# Patient Record
Sex: Female | Born: 1950 | Race: White
Health system: Northeastern US, Academic
[De-identification: ages and names within clinical notes are randomized; demographics above are authoritative.]

## PROBLEM LIST (undated history)

## (undated) DIAGNOSIS — R042 Hemoptysis: Secondary | ICD-10-CM

## (undated) DIAGNOSIS — M797 Fibromyalgia: Secondary | ICD-10-CM

## (undated) DIAGNOSIS — F32A Depression, unspecified: Secondary | ICD-10-CM

## (undated) HISTORY — DX: Depression, unspecified: F32.A

## (undated) HISTORY — DX: Fibromyalgia: M79.7

## (undated) HISTORY — DX: Hemoptysis: R04.2

---

## 2004-11-08 LAB — HM COLONOSCOPY

## 2006-09-04 DIAGNOSIS — J309 Allergic rhinitis, unspecified: Secondary | ICD-10-CM | POA: Insufficient documentation

## 2006-09-04 DIAGNOSIS — F32A Depression, unspecified: Secondary | ICD-10-CM | POA: Insufficient documentation

## 2006-09-04 DIAGNOSIS — K912 Postsurgical malabsorption, not elsewhere classified: Secondary | ICD-10-CM | POA: Insufficient documentation

## 2008-11-11 ENCOUNTER — Encounter: Payer: Self-pay | Admitting: Cardiology

## 2009-06-29 LAB — HM MAMMOGRAPHY

## 2009-07-15 ENCOUNTER — Ambulatory Visit: Payer: Self-pay | Admitting: Sports Medicine

## 2009-07-29 ENCOUNTER — Ambulatory Visit: Payer: Self-pay | Admitting: Primary Care

## 2009-09-24 ENCOUNTER — Encounter: Payer: Self-pay | Admitting: Gastroenterology

## 2009-09-24 ENCOUNTER — Ambulatory Visit: Payer: Self-pay | Admitting: Primary Care

## 2009-09-25 NOTE — Progress Notes (Signed)
 Reason For Visit   59 year old woman comes in with concerns of a rash on the occiput of her   scalp, splitting of the skin of her fingertips and itchy rash across the   tops of both shoulders.  She has not changed soaps.  She is not using any   new body lotions.  She is unaware whether her home has a functioning   humidifier but she does have forced air heat.     Medication list was reviewed and reconciled.  Allergies   Egg based serum; Rash  Penicillins.  Current Meds   Prempro 0.45-1.5 MG Tablet;TAKE 1 TABLET DAILY.; RPT  FLUoxetine HCl 20 MG Capsule;TAKE 3 CAPSULE DAILY; Rx  Nabumetone 500 MG Tablet;TAKE 1 TABLET TWICE DAILY.; Rx  Amphetamine-Dextroamphetamine 7.5 MG Tablet;TAKE 1 TABLET 3 TIMES DAILY   code B; Rx  Geodon 20 MG Capsule;TAKE 1 CAPSULE DAILY; Rx  Multivitamins Capsule;TAKE 1 CAPSULE DAILY.; RPT  Vitamin D 1000 UNIT Tablet;TAKE 2 TABLET DAILY; RPT  Calcium 500 MG Tablet;TAKE 1 TABLET 3 TIMES DAILY; RPT.  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  Vital Signs   Recorded by [facility] on 24 Sep 2009 12:15 PM  BP:120/78,   HR: 84 b/min,   Height: 64 in, Weight: 116 lb, BMI: 19.9 kg/m2,   O2 Sat: 98 (%SpO2).  Physical Exam   Examination of the skin of her posterior scalp reveals erythematous skin   from scratching without any rashes noted.  Across the backs of both   shoulders the skin is excoriated and scratching and erythematous with no   clear rash.  Fingertips are fissured and lichenified from chronic dryness.    Examination of the rest of her torso reveals no rashes.  Assessment   She appears to have dermatitis from dry winter skin.  Explained the need   for increased humidification at home.  Advised to take shorter showers with   cool water.  Will treat the excoriated areas on her shoulders with   triamcinolone cream as well as her hands twice daily she will then cover   with a thick skin lotion.  Itching on her scalp will try fluocinonide    solution twice daily for 10 days.  Orders   Triamcinolone Acetonide 0.1 % Cream;APPLY SPARINGLY AND MASSAGE IN TWICE   DAILY; Qty1; R0; Rx.  Fluocinonide 0.05 % Solution;APPLY SPARINGLY TO SCALP TWICE DAILY; Qty1;   R0; Rx.  Signature   Electronically signed by: Lonny Prude  M.D.; 09/25/2009 12:46 PM EST;   Chartered loss adjuster.

## 2009-10-09 ENCOUNTER — Ambulatory Visit: Payer: Self-pay | Admitting: Primary Care

## 2009-10-09 NOTE — Progress Notes (Signed)
 Reason For Visit   59 year-old woman comes in for atypical chest pain.     On Thursday evening she was having her hair colored and noted increased   salivation.  At home later that evening while lying on her stomach she   developed terrible pain in her anterior chest going into her right jaw the   pain was sharp and sudden onset.  She began to elevate again.  She had no   shortness of breath.  Denies any diaphoresis.  She took a Vicodin and   within our symptoms were gone.  She has been exercising 5 days a week doing   cardiac exercise with no difficulty.  She is a nonsmoker.     She describes the pain under her sternum as a tight pressure and is   lingering slightly still.  She has no trouble with walking or being active.    She does not have high cholesterol.     Medication list was reviewed and reconciled.  Allergies   Egg based serum; Rash  Penicillins.  Current Meds   Vitamin D 1000 UNIT Tablet;TAKE 2 TABLET DAILY; RPT  Multivitamins Capsule;TAKE 1 CAPSULE DAILY.; RPT  Triamcinolone Acetonide 0.1 % Cream;APPLY SPARINGLY AND MASSAGE IN TWICE   DAILY.; Rx  Amphetamine-Dextroamphetamine 7.5 MG Tablet;TAKE 1 TABLET 3 TIMES DAILY   code B; Rx  Prempro 0.3-1.5 MG Tablet;; RPT  Nabumetone 500 MG Tablet;TAKE 1 TABLET TWICE DAILY.; Rx  Geodon 20 MG Capsule;TAKE 1 CAPSULE DAILY; Rx  FLUoxetine HCl 20 MG Capsule;TAKE 3 CAPSULE DAILY; Rx  Prempro 0.45-1.5 MG Tablet;TAKE 1 TABLET DAILY.; RPT  Calcium 500 MG Tablet;TAKE 1 TABLET 3 TIMES DAILY; RPT  Fluocinolone Acetonide 0.01 % Solution;APPLY SPARINGLY TO scalp TWICE   DAILY; Rx.  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  Vital Signs   Recorded by [facility] on 09 Oct 2009 08:26 AM  BP:120/76,   HR: 75 b/min,   Temp: 97.7 F,   Height: 64 in, Weight: 116 lb, BMI: 19.9 kg/m2,   O2 Sat: 98 (%SpO2).  Physical Exam   Appears well in no distress.  HEENT: No adenopathy in the anterior-posterior cervical chain.  No    clavicular adenopathy.  Oropharynx clear  Neck veins flat.  No thyroid nodules.    Heart: RRR S1-S2 without murmurs  Lungs: Clear to auscultation  Chest wall is nontender to palpation.  Sternum is nontender.  Results   12 LEAD ELECTROCARDIOGRAM:  Normal sinus rhythm, rate: 75, axis 75   : PR, QRS intervals normal;ST   T-wave segments normal no ischemia.  Impression:  Normal ECG  .  Assessment   59 year old woman with known specific cardiac risk factors presents with   atypical substernal chest pain of sudden onset she describes the pain as a   tightness and it did radiate to her right jaw.  She does not have frequent   GERD symptoms.  Today she feels tired but is not having any recurrent   symptoms like last Thursday night.  She felt tired and did not go to work   on Friday.     Unclear whether this pain represents esophageal spasm from reflux or   atypical cardiac symptoms.  Will proceed with stress echocardiogram to   assess her exercise capacity.  Signature   Electronically signed by: Lonny Prude  M.D.; 10/09/2009 3:22 PM EST;   Chartered loss adjuster.

## 2009-11-02 ENCOUNTER — Ambulatory Visit: Payer: Self-pay | Admitting: Primary Care

## 2009-11-02 NOTE — Progress Notes (Signed)
Reason For Visit   59 year old woman follows ADD and depression.  She has had one additional   episode of sharp chest pain that occurred spontaneously while not exerting   herself.  She had a normal stress echocardiogram February 12.  The chest   pain resolved spontaneously but did last one hour and had radiated into her   right jaw.  She continues to exercise without difficulty.     She feels current dose of dexamethasone he is working very well.  She feels   her mood and depression are also doing fine with current doses of   fluoxetine and Geodon.  She is seeing a therapist Dr. Wallace Cullens.     She recently returned from a nice vacation in [place] visiting her father.    Today she has no complaints.     Medication list was reviewed and reconciled.  Allergies   Egg based serum; Rash  Penicillins.  Current Meds   Calcium 500 MG Tablet;TAKE 1 TABLET 3 TIMES DAILY; RPT  Vitamin D 1000 UNIT Tablet;TAKE 2 TABLET DAILY; RPT  Multivitamins Capsule;TAKE 1 CAPSULE DAILY.; RPT  Prempro 0.45-1.5 MG Tablet;TAKE 1 TABLET DAILY.; RPT  FLUoxetine HCl 20 MG Capsule;TAKE 3 CAPSULE DAILY; Rx  Geodon 20 MG Capsule;TAKE 1 CAPSULE DAILY; Rx  Nabumetone 500 MG Tablet;TAKE 1 TABLET TWICE DAILY.; Rx  Triamcinolone Acetonide 0.1 % Cream;APPLY SPARINGLY AND MASSAGE IN TWICE   DAILY.; Rx  Amphetamine-Dextroamphetamine 7.5 MG Tablet;TAKE 1 TABLET 3 TIMES DAILY   code B; Rx  Fluocinolone Acetonide 0.01 % Solution;APPLY SPARINGLY TO scalp TWICE   DAILY; Rx  Prempro 0.3-1.5 MG Tablet;; RPT.  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  Vital Signs   Recorded by [facility] on 02 Nov 2009 08:54 AM  BP:112/72  Recorded by [facility] on 02 Nov 2009 08:53 AM  HR: 78 b/min,   Temp: 98.2 F,   Height: 64 in, Weight: 116 lb, BMI: 19.9 kg/m2,   O2 Sat: 98 (%SpO2).  Physical Exam   Entire visit spent in counseling.  Assessment   ADD and depression are doing quite well on current medication regimen.       Will make no changes.  Followup in 4 months.     Episodic chest pain appears to perhaps be related to esophageal spasm.    Will continue to observe.  Orders   Renew Amphetamine-Dextroamphetamine 7.5 MG Tablet;TAKE 1 TABLET 3 TIMES   DAILY code B. MDD 3 pills; Qty270; R0; Rx.  Signature   Electronically signed by: Lonny Prude  M.D.; 11/02/2009 10:12 PM EST;   Chartered loss adjuster.

## 2010-02-15 ENCOUNTER — Ambulatory Visit: Payer: Self-pay | Admitting: Primary Care

## 2010-02-16 NOTE — Progress Notes (Signed)
Reason For Visit   59 year old woman comes in to discuss adjustments for her medications. She   notes that on days when the weather is cloudy cold and gloomy she feels   achy fatigue as if she were physically sick. On some days she feels much   better. She was experimenting and on these days where she experienced   increased fatigue and achiness she would take Adderall XR 20 mg once and   this would help quite a bit. She had been taking Adderall immediate release   7.5 mg 3 times daily for ADD. However she had some left over 20 mg pills.   She wonders if she should switch to a 20 mg sustained release pill and then   used a 7.5 mg pill later in the afternoon.     She feels her mood and depression are doing well and she remains physically   active.     Medication list was reviewed and reconciled.  Allergies   Egg based serum; Rash  Penicillins.  Current Meds   Calcium 500 MG Tablet;TAKE 1 TABLET 3 TIMES DAILY; RPT  Vitamin D 1000 UNIT Tablet;TAKE 2 TABLET DAILY; RPT  Multivitamins Capsule;TAKE 1 CAPSULE DAILY.; RPT  Prempro 0.45-1.5 MG Tablet;TAKE 1 TABLET DAILY.; RPT  Geodon 20 MG Capsule;TAKE 1 CAPSULE DAILY; Rx  Nabumetone 500 MG Tablet;TAKE 1 TABLET TWICE DAILY.; Rx  Triamcinolone Acetonide 0.1 % Cream;APPLY SPARINGLY AND MASSAGE IN TWICE   DAILY.; Rx  Fluocinolone Acetonide 0.01 % Solution;APPLY SPARINGLY TO scalp TWICE   DAILY; Rx  Prempro 0.3-1.5 MG Tablet;; RPT  FLUoxetine HCl 20 MG Capsule;TAKE 3 CAPSULE DAILY; Rx  Amphetamine-Dextroamphetamine 7.5 MG Tablet;TAKE 1 TABLET 3 TIMES DAILY   code B. MDD 3 pills; Rx.  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  Vital Signs   Recorded by [facility] on 15 Feb 2010 08:51 AM  BP:116/78,   HR: 75 b/min,   Temp: 98.1 F,   Height: 64 in, Weight: 114 lb, BMI: 19.6 kg/m2,   O2 Sat: 99 (%SpO2).  Physical Exam   Entire visit was spent in discussion.  Patient Profile   Prev medicine administration of health risk  questionnaire  ANNUAL RISK  Assessment   Mood disorder including depression, dysphoria with ADD. I agree that since   he seems to do better with the longer acting larger dose of dexamphetamine   will switch to 20 mg sustained release in the morning and have her take 7.5   mg in the afternoon.  Orders   Renew Amphetamine-Dextroamphetamine 7.5 MG Tablet;TAKE 1 TABLET later in   the afternoon; Qty30; R0; Rx.  Amphetamine-Dextroamphetamine 20 MG Capsule Extended Release 24 Hour;TAKE 1   CAPSULE DAILY; Qty30; R0; Rx.  Signature   Electronically signed by: Lonny Prude  M.D.; 02/16/2010 10:41 AM EST;   Chartered loss adjuster.

## 2010-03-08 ENCOUNTER — Ambulatory Visit: Payer: Self-pay | Admitting: Primary Care

## 2010-03-21 ENCOUNTER — Ambulatory Visit: Payer: Self-pay | Admitting: Primary Care

## 2010-03-21 NOTE — Progress Notes (Signed)
Reason For Visit   59 year old woman follows up for depression. Overall she feels quite well.   She feels strongly that gluten does make her feel more poorly especially   with fatigue and stomach upset. When she has weak based posterior she   developed stomach upset and she even develops a faint skin rash her chest   this rash is not consistent with dermatitis herpetiformis.     She adjusts her dose of Adderall on her own. On days where the weather is   dark and gloomy and raining she will take 20 mg sustained release in the   morning and this seems to help with her energy. Better sunn ya day she will   take 7.5 mg 3 times daily. This seems to work the best for her.     She feels she is doing well on current dose of Geodon and Prozac. Work is   going well.     Medication list was reviewed and reconciled. Continues to see her therapist   every couple weeks and this has been very helpful.  Allergies   Egg based serum; Rash  Penicillins.  Current Meds   Calcium 500 MG Tablet;TAKE 1 TABLET 3 TIMES DAILY; RPT  Vitamin D 1000 UNIT Tablet;TAKE 2 TABLET DAILY; RPT  Multivitamins Capsule;TAKE 1 CAPSULE DAILY.; RPT  Prempro 0.45-1.5 MG Tablet;TAKE 1 TABLET DAILY.; RPT  Nabumetone 500 MG Tablet;TAKE 1 TABLET TWICE DAILY.; Rx  Triamcinolone Acetonide 0.1 % Cream;APPLY SPARINGLY AND MASSAGE IN TWICE   DAILY.; Rx  Prempro 0.3-1.5 MG Tablet;; RPT  FLUoxetine HCl 20 MG Capsule;TAKE 3 CAPSULE DAILY; Rx  Amphetamine-Dextroamphetamine 7.5 MG Tablet;TAKE 1 TABLET later in the   afternoon; Rx  Amphetamine-Dextroamphetamine 20 MG Capsule Extended Release 24 Hour;TAKE 1   CAPSULE DAILY.; Rx  Geodon 20 MG Capsule;TAKE 1 CAPSULE DAILY; Rx  Chlorhexidine Gluconate 0.12 % Solution;; RPT  Ibuprofen 800 MG Tablet;; RPT  Fluocinolone Acetonide 0.01 % Solution;APPLY SPARINGLY TO scalp TWICE   DAILY; Rx.  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  Vital Signs   Recorded by [facility] on 21 Mar 2010 08:55 AM  BP:134/84,   HR: 84 b/min,   Temp: 99.0 F,   Height: 64 in, Weight: 115 lb, BMI: 19.7 kg/m2,   O2 Sat: 99 (%SpO2).  Physical Exam   Appears well in no distress.  HEENT: No adenopathy in the anterior-posterior cervical chain.  No   clavicular adenopathy.  Oropharynx clear  Neck veins flat.  No thyroid nodules.    Heart: RRR S1-S2 without murmurs  Lungs: Clear to auscultation.  Assessment   Depression anxiety and fatigue persists intermittently but otherwise she   feels she is going quite well and stable. Not requesting any change in   medication. Continues with therapy. Renewed medications     Followup in 3 months.  Orders   Renew Amphetamine-Dextroamphetamine 7.5 MG Tablet;TAKE 1 TABLET 3 TIMES   DAILY code B; Qty270; R0; Rx.  Signature   Electronically signed by: Lonny Prude  M.D.; 03/21/2010 10:40 PM EST;   Chartered loss adjuster.

## 2010-04-28 ENCOUNTER — Ambulatory Visit: Payer: Self-pay | Admitting: Primary Care

## 2010-04-28 NOTE — Progress Notes (Signed)
Reason For Visit   59 year old woman comes in with respiratory symptoms for the past 2 days.   She was treated on August 13 with azithromycin or a sinus infection. She   improved but over the past 2 days has had a fever of 100.2, dark green   phlegm and occasional cough no sinus congestion. Not short of breath. She   ass fatigue. occasional chills. Nonsmoker     Medication list was reviewed and reconciled.  Allergies   Egg based serum; Rash  Penicillins.  Current Meds   Calcium 500 MG Tablet;TAKE 1 TABLET 3 TIMES DAILY; RPT  Vitamin D 1000 UNIT Tablet;TAKE 2 TABLET DAILY; RPT  Multivitamins Capsule;TAKE 1 CAPSULE DAILY.; RPT  Nabumetone 500 MG Tablet;TAKE 1 TABLET TWICE DAILY.; Rx  Prempro 0.3-1.5 MG Tablet;; RPT  FLUoxetine HCl 20 MG Capsule;TAKE 3 CAPSULE DAILY; Rx  Amphetamine-Dextroamphetamine 20 MG Capsule Extended Release 24 Hour;TAKE 1   CAPSULE DAILY.; Rx  Geodon 20 MG Capsule;TAKE 1 CAPSULE DAILY; Rx  Chlorhexidine Gluconate 0.12 % Solution;; RPT  Ibuprofen 800 MG Tablet;; RPT  Fluocinolone Acetonide 0.01 % Solution;APPLY SPARINGLY TO scalp TWICE   DAILY; Rx  Amphetamine-Dextroamphetamine 7.5 MG Tablet;TAKE 1 TABLET 3 TIMES DAILY   code B; Rx  Triamcinolone Acetonide 0.1 % Cream;APPLY SPARINGLY AND MASSAGE IN TWICE   DAILY.; Rx.  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  Vital Signs   Recorded by [facility] on 28 Apr 2010 10:48 AM  BP:120/80  Recorded by [facility] on 28 Apr 2010 10:47 AM  HR: 85 b/min,   Temp: 98.3 F,   Height: 63.5 in, Weight: 114 lb, BMI: 19.9 kg/m2,   O2 Sat: 99 (%SpO2).  Physical Exam   Appears well in no distress.  HEENT: No adenopathy in the anterior-posterior cervical chain.  No   clavicular adenopathy.  Oropharynx clear  Neck veins flat.  No thyroid nodules.    Heart: RRR S1-S2 without murmurs  Lungs: Clear to auscultation.  Assessment   Upper respiratory infection uncomplicated. However she has fever and   discolored  phlegm. Will treat with doxycycline.  Orders   Doxycycline Hyclate 100 MG Tablet;TAKE 1 TABLET TWICE DAILY; Qty20; R0; Rx.  Work Restrictions;due to illness out of work 8/31-09/08/2009; Qty1; R0; Rx.  Signature   Electronically signed by: Lonny Prude  M.D.; 04/28/2010 9:34 PM EST;   Chartered loss adjuster.

## 2010-05-31 ENCOUNTER — Ambulatory Visit: Payer: Self-pay | Admitting: Primary Care

## 2010-05-31 NOTE — Progress Notes (Signed)
 Reason For Visit   59 year old woman followed for depression. Overall she is doing quite well.   Her cough and congestion her last visit has resolved. She felt ill with   doxycycline and was not able to complete antibiotic.     She continues to have significant fatigue when impairment or pressure drops   and she finds improvement when she takes a single long-acting Adderall 20   mg intermittently.     Otherwise she feels well and has been able to exercise. Work has been fine   not too stressful.     There's been no change in her medications for depression.  Allergies   Doxycycline Hyclate TABS; Nausea  Egg based serum; Rash  Penicillins.  Current Meds   Work Restrictions;due to illness out of work 8/31-09/08/2009; Rx  Amphetamine-Dextroamphetamine 7.5 MG Tablet;TAKE 1 TABLET 3 TIMES DAILY   code B; Rx  Fluocinolone Acetonide 0.01 % Solution;APPLY SPARINGLY TO scalp TWICE   DAILY; Rx  FLUoxetine HCl 20 MG Capsule;TAKE 3 CAPSULE DAILY; Rx  Nabumetone 500 MG Tablet;TAKE 1 TABLET TWICE DAILY.; Rx  Calcium 500 MG Tablet;TAKE 1 TABLET 3 TIMES DAILY; RPT  Amphetamine-Dextroamphetamine 20 MG Capsule Extended Release 24 Hour;TAKE 1   CAPSULE DAILY.; Rx  Geodon 20 MG Capsule;TAKE 1 CAPSULE DAILY; Rx  Prempro 0.3-1.5 MG Tablet;; RPT  Vitamin D 1000 UNIT Tablet;TAKE 2 TABLET DAILY; RPT  Multivitamins Capsule;TAKE 1 CAPSULE DAILY.; RPT  Triamcinolone Acetonide 0.1 % Cream;APPLY SPARINGLY AND MASSAGE IN TWICE   DAILY.; Rx.  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  Vital Signs   Recorded by [facility] on 31 May 2010 08:56 AM  BP:118/80,   HR: 84 b/min,   Temp: 98.6 F,   Height: 63.5 in, Weight: 115 lb, BMI: 20.1 kg/m2,   O2 Sat: 99 (%SpO2).  Physical Exam   Appears well in no distress.  HEENT: No adenopathy in the anterior-posterior cervical chain.  No   clavicular adenopathy.  Oropharynx clear  Neck veins flat.  No thyroid nodules.    Heart: RRR S1-S2 without  murmurs  Lungs: Clear to auscultation.  Assessment   Depression and fatigue doing well with her change with weather change with   dropping her pressure. occasional use of longer acting Adderall has been   useful to help with these days of worsening fatigue.     Followup in 4 months.  Signature   Electronically signed by: Lonny Prude  M.D.; 05/31/2010 10:13 PM EST;   Chartered loss adjuster.

## 2010-06-23 ENCOUNTER — Ambulatory Visit: Payer: Self-pay | Admitting: Sports Medicine

## 2010-06-23 ENCOUNTER — Ambulatory Visit: Payer: Self-pay

## 2010-07-04 NOTE — Progress Notes (Signed)
 Shanedra is seen in follow-up status post right knee partial medial and  lateral meniscectomy and chondroplasty on 11/16/2008. Primary complaint is  anterior-based knee pain. Has pain with a burning quality after prolonged  sitting. Experienced significant pain with attempt at golf this summer.  Would like to discuss.    PHYSICAL EXAMINATION: Examination demonstrates range of 0 to 135 degrees.  Moderate patellofemoral crepitance with range of motion. Ligamentously  stable. No joint line tenderness.    ASSESSMENT:  Chondromalacia patella. Imaging from arthroscopy was reviewed,  demonstrating significant grade 3 fibrillation diffusely, as well as  exposed bone on the patella.    PLAN:  I would treat her symptoms currently as arthritic. She will start  taking glucosamine on a regular basis. She will attempt meloxicam 15 mg  daily. She had an adverse reaction to corticosteroid injection in the past,  and therefore, alternate option would be a viscosupplementation series.  She will let me know if she would like to proceed with the  viscosupplementation series.                    Electronically Signed and Finalized  by  Verita Schneiders, MD 07/04/2010 06:35  ___________________________________________  Verita Schneiders, MD      DD:   06/23/2010  DT:   06/24/2010  5:23 P  [id number]  [id number]    cc:

## 2010-07-07 ENCOUNTER — Ambulatory Visit: Payer: Self-pay

## 2010-07-19 ENCOUNTER — Ambulatory Visit: Payer: Self-pay | Admitting: Primary Care

## 2010-07-19 NOTE — Progress Notes (Signed)
 Reason For Visit   59 year old woman comes in with concerns of waves of fatigue that seemed to   occur a few times a day several days per week. She notes fatigue exhaustion   and occasional nausea. She states 9 hours per night. She does not feel   depressed he is not anxious. Work is okay. She exercises without difficulty   4 times a week he remains motivated. She continues on a gluten-free diet   which she has chosen to do empirically.     More recently she complains of feeling fatigue and completed all day long.   She wonders if she is adrenal insufficient. She has extensive testing in   the past including normal B12 and thyroid function testing.     She is not hypotensive and has not lost weight     Medication list was reviewed and reconciled.  Allergies   Doxycycline Hyclate TABS; Nausea  Egg based serum; Rash  Penicillins.  Current Meds   Work Restrictions;due to illness out of work 8/31-09/08/2009; Rx  Nabumetone 500 MG Tablet;TAKE 1 TABLET TWICE DAILY.; Rx  FLUoxetine HCl 20 MG Capsule;TAKE 3 CAPSULE DAILY; Rx  Amphetamine-Dextroamphetamine 20 MG Capsule Extended Release 24 Hour;TAKE 1   CAPSULE DAILY.; Rx  Meloxicam 15 MG Tablet;; RPT  Vitamin D 1000 UNIT Tablet;TAKE 1 TABLET DAILY.; Rx  Amphetamine-Dextroamphetamine 7.5 MG Tablet;TAKE 1 TABLET 3 TIMES DAILY   code B; Rx  Calcium 500 MG Tablet;TAKE 1 TABLET 3 TIMES DAILY; RPT  Geodon 20 MG Capsule;TAKE 1 CAPSULE DAILY; Rx  Multivitamins Capsule;TAKE 1 CAPSULE DAILY.; RPT.  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  Vital Signs   Recorded by [facility] on 19 Jul 2010 09:21 AM  BP:122/76,   HR: 86 b/min,   Temp: 98.3 F,   Height: 63.5 in, Weight: 117 lb, BMI: 20.4 kg/m2,   O2 Sat: 97 (%SpO2).  Physical Exam   Appears well in no distress.  HEENT: No adenopathy in the anterior-posterior cervical chain.  No   clavicular adenopathy.  Oropharynx clear  Neck veins flat.  No thyroid nodules.    Heart: RRR  S1-S2 without murmurs  Lungs: Clear to auscultation.  Assessment   59 year old woman with history of depression feels that she is not   depressed and is not anxious. She remains physically active and is able to   exercise without weakness or fatigue. However weigh himself if he calmed   over her throughout the day and she feels depleted. She worries she is a   generally insufficient.     Will check a.m. salivary cortisol to screen for adrenal insufficiency. Also   repeat B12 CBC iron function tests and iron.  Signature   Electronically signed by: Lonny Prude  M.D.; 07/19/2010 11:10 PM EST;   Chartered loss adjuster.

## 2010-07-20 ENCOUNTER — Ambulatory Visit
Admit: 2010-07-20 | Discharge: 2010-07-20 | Disposition: A | Discharge status: Home / Self Care | Payer: Self-pay | Source: Ambulatory Visit | Attending: Primary Care | Admitting: Primary Care

## 2010-07-20 LAB — COMPREHENSIVE METABOLIC PANEL
ALT: 27 U/L (ref 0–35)
AST: 40 U/L — ABNORMAL HIGH (ref 0–35)
Albumin: 4.6 g/dL (ref 3.5–5.2)
Alk Phos: 75 U/L (ref 35–105)
Anion Gap: 11 (ref 7–16)
Bilirubin,Total: 0.4 mg/dL (ref 0.0–1.2)
CO2: 26 mmol/L (ref 20–28)
Calcium: 9 mg/dL (ref 8.6–10.2)
Chloride: 102 mmol/L (ref 96–108)
Creatinine: 0.86 mg/dL (ref 0.51–0.95)
GFR,Black: >59 *
GFR,Caucasian: >59 *
Glucose: 82 mg/dL (ref 74–106)
Lab: 14 mg/dL (ref 6–20)
Potassium: 4.6 mmol/L (ref 3.3–5.1)
Sodium: 139 mmol/L (ref 133–145)
Total Protein: 6.5 g/dL (ref 6.3–7.7)

## 2010-07-20 LAB — CBC AND DIFFERENTIAL
Baso # K/uL: 0 THOU/uL (ref 0.0–0.1)
Basophil %: 0.8 % (ref 0.1–1.2)
Eos # K/uL: 0.1 THOU/uL (ref 0.0–0.4)
Eosinophil %: 2.1 % (ref 0.7–5.8)
Hematocrit: 42 % (ref 34–45)
Hemoglobin: 13.7 g/dL (ref 11.2–15.7)
Lymph # K/uL: 0.8 THOU/uL — ABNORMAL LOW (ref 1.2–3.7)
Lymphocyte %: 16.9 % — ABNORMAL LOW (ref 19.3–51.7)
MCV: 90 fL (ref 79–95)
Mono # K/uL: 0.5 THOU/uL (ref 0.2–0.9)
Monocyte %: 10.6 % (ref 4.7–12.5)
Neut # K/uL: 3.3 THOU/uL (ref 1.6–6.1)
Platelets: 220 THOU/uL (ref 160–370)
RBC: 4.6 MIL/uL (ref 3.9–5.2)
RDW: 13.7 % (ref 11.7–14.4)
Seg Neut %: 69.6 % (ref 34.0–71.1)
WBC: 4.7 THOU/uL (ref 4.0–10.0)

## 2010-07-20 LAB — VITAMIN B12: Vitamin B12: 908 pg/mL (ref 211–946)

## 2010-07-20 LAB — FERRITIN: Ferritin: 29 ng/mL (ref 10–120)

## 2010-07-20 LAB — TSH: TSH: 0.97 u[IU]/mL (ref 0.27–4.20)

## 2010-07-23 LAB — CORTISOL, SALIVARY: Cortisol,Salivary: 792 ng/dL

## 2010-07-25 LAB — VITAMIN B6: Vitamin B6: 78.6 nmol/L (ref 20.0–125.0)

## 2010-08-30 ENCOUNTER — Ambulatory Visit: Payer: Self-pay | Admitting: Primary Care

## 2010-08-30 NOTE — Progress Notes (Signed)
 Reason For Visit   60 year old woman comes in to followup for ADD and fatigue. She continues   to note episodes of fatigue that occur randomly throughout the day. It is a   generalized fatigue that will last up to one hour. She occasionally notes a   little nausea. She monitor her blood pressure carefully and it was all over   the board slightly low in slightly high but nothing dramatic.     Her mood is doing well and she denies any depression.     She says when she feels fatigue it seems that her energy is crashing. I   raise the question whether this may be due to wearing off of her short   acting Adderall which has been 7.5 mg 2 times daily. Due to a Warehouse manager of generic Adderall she agreed to try 15 mg of sustained release   each morning.     She will call me in a few weeks to tell me if she feels any different.     Followup in 3 months.  Allergies   Doxycycline Hyclate TABS; Nausea  Egg based serum; Rash  Penicillins.  Current Meds   Calcium 500 MG Tablet;TAKE 1 TABLET 3 TIMES DAILY; RPT  Multivitamins Capsule;TAKE 1 CAPSULE DAILY.; RPT  Nabumetone 500 MG Tablet;TAKE 1 TABLET TWICE DAILY.; Rx  FLUoxetine HCl 20 MG Capsule;TAKE 3 CAPSULE DAILY; Rx  Geodon 20 MG Capsule;TAKE 1 CAPSULE DAILY; Rx  Work Restrictions;due to illness out of work 8/31-09/08/2009; Rx  Amphetamine-Dextroamphetamine 20 MG Capsule Extended Release 24 Hour;TAKE 1   CAPSULE DAILY.; Rx  Meloxicam 15 MG Tablet;; RPT  Vitamin D 1000 UNIT Tablet;TAKE 1 TABLET DAILY.; Rx  Amphetamine-Dextroamphetamine 7.5 MG Tablet;TAKE 1 TABLET 3 TIMES DAILY   code B; Rx.  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  Vital Signs   Recorded by [facility] on 30 Aug 2010 08:56 AM  BP:128/86,   HR: 85 b/min,   Temp: 98.2 F,   Height: 63.75 in, Weight: 119 lb, BMI: 20.6 kg/m2,   O2 Sat: 98 (%SpO2).  Orders   Adderall XR 15 MG Capsule Extended Release 24 Hour;TAKE 1 CAPSULE DAILY IN   THE MORNING. MDD 1  pill; Qty90; R0; Rx.  Signature   Electronically signed by: Lonny Prude  M.D.; 08/30/2010 11:26 PM EST;   Chartered loss adjuster.

## 2010-09-04 NOTE — Miscellaneous (Unsigned)
 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)   Diagnosis: Allergic Rhinitis (477.9)   Diagnosis: Depression (311)     Alerts  Allergy - Doxycycline Hyclate TABS Nausea   Allergy - Egg based serum Rash   Allergy - Penicillins     Medications  Adderall XR 15 MG Capsule Extended Release 24 Hour; TAKE 1 CAPSULE DAILY IN   THE MORNING.MMD 1Code B ; Rx   Adderall XR 20 MG Capsule Extended Release 24 Hour; TAKE 1 CAPSULE DAILY   instead of Adderall XR 15mg  on day with increased fatgue ; Rx   Amphetamine-Dextroamphetamine 7.5 MG Tablet; TAKE 1 TABLET DAILY AS   DIRECTED CODE B ; Rx   Calcium 500 MG Tablet; TAKE 1 TABLET 3 TIMES DAILY ; RPT   FLUoxetine HCl 20 MG Capsule; TAKE 3 CAPSULE DAILY ; Rx   Geodon 20 MG Capsule; TAKE 1 CAPSULE DAILY ; Rx   Multivitamins Capsule; TAKE 1 CAPSULE DAILY. ; RPT   Nabumetone 500 MG Tablet; TAKE 1 TABLET TWICE DAILY. using prn ; Rx   ProAir HFA 108 (90 Base) MCG/ACT Aerosol Solution; INHALE 2 PUFFS EVERY 4-6   HOURS PRN COUGH, WHEEZE, SOB, CHEST TIGHTNESS, SPACED 60 SECONDS APART. ;   Rx   Vitamin D 1000 UNIT Tablet; TAKE 2 TABLET DAILY ; Rx     Immunizations  Td   Tdap (Adacel)

## 2010-10-14 ENCOUNTER — Encounter: Payer: Self-pay | Admitting: Primary Care

## 2010-10-14 ENCOUNTER — Ambulatory Visit: Payer: Self-pay | Admitting: Primary Care

## 2010-10-14 NOTE — Progress Notes (Signed)
 Reason For Visit   Cough.  HPI   3 weeks ago she had a flulike illness with sore throat headache chills and   fatigue which generally improved however she started coughing and the cough   has persisted and got worse over time.  She's had productive sputum, no   chest pain or shortness of breath but some chest tightness with exhalation.    She's had chills and a temperature of 99.6 last night.  She has not used   an inhaler recently although she remembers giving being given one in the   past.  Her energy is poor.     Medications reviewed and reconciled.  Allergies   Doxycycline Hyclate TABS; Nausea  Egg based serum; Rash  Penicillins.  Current Meds   Calcium 500 MG Tablet;TAKE 1 TABLET 3 TIMES DAILY; RPT  Multivitamins Capsule;TAKE 1 CAPSULE DAILY.; RPT  FLUoxetine HCl 20 MG Capsule;TAKE 3 CAPSULE DAILY; Rx  Geodon 20 MG Capsule;TAKE 1 CAPSULE DAILY; Rx  Vitamin D 1000 UNIT Tablet;TAKE 1 TABLET DAILY.; Rx  Adderall XR 15 MG Capsule Extended Release 24 Hour;TAKE 1 CAPSULE DAILY IN   THE MORNING. MDD 1 pill; Rx  Nabumetone 500 MG Tablet;TAKE 1 TABLET TWICE DAILY. using prn; Rx  Azithromycin 250 MG Tablet;2 tablets on day 1, then 1 tab each day x 4   days; Rx  ProAir HFA 108 (90 Base) MCG/ACT Aerosol Solution;INHALE 2 PUFFS EVERY 4-6   HOURS PRN COUGH, WHEEZE, SOB, CHEST TIGHTNESS, SPACED 60 SECONDS APART.; Rx.  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  Vital Signs   Recorded by Donnal Moat on 14 Oct 2010 01:53 PM  BP:124/78,  RUE,  Sitting,   HR: 90 b/min,   Temp: 98.2 F,  Tympanic,   Height: 63.75 in, Weight: 119 lb, BMI: 20.6 kg/m2,   O2 Sat: 98 (%SpO2).  Physical Exam   Vital signs as noted  She last took Aleve at 9 AM     Pleasant woman appears in no acute distress  Anicteric conjunctiva pink pupils equal reactive  Oropharynx mild erythema no exudate  No cervical lymphadenopathy  Lungs are clear no crackles wheezes rhonchi  Heart exam regular rate and  rhythm normal S1-S2 no murmur.  Assessment   Assessment and Plan: Bronchitis.  Given her initial improvement with recent   worsening symptoms and some low-grade fevers as well as fatigue I did start   her on some Zithromax and gave her an inhaler for when necessary use.    Advised to call if she has worsening symptoms which I reviewed with her at   length  .  Signature   Electronically signed by: Donnal Moat  M.D.; 10/14/2010 2:13 PM EST.

## 2010-11-28 ENCOUNTER — Encounter: Payer: Self-pay | Admitting: Primary Care

## 2010-11-28 ENCOUNTER — Ambulatory Visit: Payer: Self-pay | Admitting: Primary Care

## 2010-11-28 NOTE — Progress Notes (Signed)
 Reason For Visit   60 year old woman comes in to follow for ADD and is concerned that her   sense of body aching and fatigue related to weather and barometric changes   may have something to do with a rheumatological problem.  She notes new   triggering of her left thumb.     Overall she feels she has done better on a gluten-free diet. She is also   pleased with the benefit of Adderall. She usually takes Adderall XR 15mg  in   the morning and Adderall 7.5 mg in mid afternoon. When there are weather   changes when she feels more fatigue and achy she will take Adderall XR 20   mg.     Her weight is up about 4 pounds which is good. She had been underweight.   Her mood and depression are fine except when she has increased fatigue   during changes in the weather.     She denies any specific swelling of her joints or tenderness of her muscles.     Medication list was reviewed and reconciled.  Allergies   Doxycycline Hyclate TABS; Nausea  Egg based serum; Rash  Penicillins.  Current Meds   Calcium 500 MG Tablet;TAKE 1 TABLET 3 TIMES DAILY; RPT  Multivitamins Capsule;TAKE 1 CAPSULE DAILY.; RPT  FLUoxetine HCl 20 MG Capsule;TAKE 3 CAPSULE DAILY; Rx  Geodon 20 MG Capsule;TAKE 1 CAPSULE DAILY; Rx  Nabumetone 500 MG Tablet;TAKE 1 TABLET TWICE DAILY. using prn; Rx  ProAir HFA 108 (90 Base) MCG/ACT Aerosol Solution;INHALE 2 PUFFS EVERY 4-6   HOURS PRN COUGH, WHEEZE, SOB, CHEST TIGHTNESS, SPACED 60 SECONDS APART.; Rx  Vitamin D 1000 UNIT Tablet;TAKE 2 TABLET DAILY; Rx  Adderall XR 15 MG Capsule Extended Release 24 Hour;TAKE 1 CAPSULE DAILY IN   THE MORNING.MMD 1Code B; Rx.  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  Vital Signs   Recorded by [facility] on 28 Nov 2010 04:17 PM  BP:132/84,   HR: 90 b/min,   Temp: 98.6 F,   Height: 63.5 in, Weight: 118 lb, BMI: 20.6 kg/m2,   O2 Sat: 98 (%SpO2).  Physical Exam   Appears well in no distress.  HEENT: No adenopathy in the  anterior-posterior cervical chain.  No   clavicular adenopathy.  Oropharynx clear  Neck veins flat.  No thyroid nodules.    Heart: RRR S1-S2 without murmurs  Lungs: Clear to auscultation  Ext: examine of the joints of her hands wrists elbows shoulders knees and   hips reveals no swelling or or erythema. She has some tenderness with   palpation of the PIP joint of the left thumb and there is triggering of the   left thumb DIP joint. Palpation of the major muscle groups fail to reveal   any trigger points.  Assessment   Fatigue and achiness brought on by weather changes suggest a   fibromyalgia-like picture. She has known osteoarthritis of her knees and   her knee pain clearly is affected by weather   as is the PIP joint of her left thumb.     I doubt she has any rheumatological problem but will do rule out work to   screen.     Left thumb DIP trigger finger will see Dr. Felipa Evener.        ADD currently doing well on present doses of Adderall.     3 months followup.  Orders   Renew Amphetamine-Dextroamphetamine 20 MG Capsule Extended  Release 24   Hour;TAKE 1 CAPSULE DAILY instead of Adderall  XR 15mg , on days with more   fatigue; Qty30; R0; Rx.  Renew Amphetamine-Dextroamphetamine 7.5 MG Tablet;TAKE 1 TABLET  DAILY code   B; Qty90; R0; Rx.  Adderall 7.5 MG Tablet;TAKE 1 TABLET DAILY AS DIRECTED CODE B; Qty90; R0;   Rx.  Adderall XR 20 MG Capsule Extended Release 24 Hour;TAKE 1 CAPSULE DAILY   instead of Adderall XR 15mg  on day with increased fatgue; Qty30; R0; Rx.  Signature   Electronically signed by: Lonny Prude  M.D.; 11/28/2010 9:27 PM EST;   Chartered loss adjuster.

## 2010-11-29 ENCOUNTER — Ambulatory Visit
Admit: 2010-11-29 | Discharge: 2010-11-29 | Disposition: A | Discharge status: Home / Self Care | Payer: Self-pay | Source: Ambulatory Visit | Attending: Primary Care | Admitting: Primary Care

## 2010-11-29 DIAGNOSIS — M25549 Pain in joints of unspecified hand: Secondary | ICD-10-CM

## 2010-11-29 LAB — COMPREHENSIVE METABOLIC PANEL
ALT: 16 U/L (ref 0–35)
AST: 27 U/L (ref 0–35)
Albumin: 4.5 g/dL (ref 3.5–5.2)
Alk Phos: 74 U/L (ref 35–105)
Anion Gap: 11 (ref 7–16)
Bilirubin,Total: 0.5 mg/dL (ref 0.0–1.2)
CO2: 27 mmol/L (ref 20–28)
Calcium: 9.3 mg/dL (ref 8.6–10.2)
Chloride: 103 mmol/L (ref 96–108)
Creatinine: 0.91 mg/dL (ref 0.51–0.95)
GFR,Black: >59 *
GFR,Caucasian: >59 *
Glucose: 79 mg/dL (ref 74–106)
Lab: 14 mg/dL (ref 6–20)
Potassium: 4.6 mmol/L (ref 3.3–5.1)
Sodium: 141 mmol/L (ref 133–145)
Total Protein: 6.5 g/dL (ref 6.3–7.7)

## 2010-11-29 LAB — CBC AND DIFFERENTIAL
Baso # K/uL: 0 10*3/uL (ref 0.0–0.1)
Basophil %: 0.9 % (ref 0.1–1.2)
Eos # K/uL: 0.1 10*3/uL (ref 0.0–0.4)
Eosinophil %: 2.4 % (ref 0.7–5.8)
Hematocrit: 42 % (ref 34–45)
Hemoglobin: 13.8 g/dL (ref 11.2–15.7)
Lymph # K/uL: 1 10*3/uL — ABNORMAL LOW (ref 1.2–3.7)
Lymphocyte %: 22.2 % (ref 19.3–51.7)
MCV: 89 fL (ref 79–95)
Mono # K/uL: 0.4 10*3/uL (ref 0.2–0.9)
Monocyte %: 8.6 % (ref 4.7–12.5)
Neut # K/uL: 3 10*3/uL (ref 1.6–6.1)
Platelets: 245 10*3/uL (ref 160–370)
RBC: 4.7 MIL/uL (ref 3.9–5.2)
RDW: 13.7 % (ref 11.7–14.4)
Seg Neut %: 65.9 % (ref 34.0–71.1)
WBC: 4.5 10*3/uL (ref 4.0–10.0)

## 2010-11-29 LAB — LIPID PANEL
Chol/HDL Ratio: 1.7
Cholesterol: 170 mg/dL
HDL: 98 mg/dL
LDL Calculated: 63 mg/dL
Non HDL Cholesterol: 72 mg/dL
Triglycerides: 44 mg/dL

## 2010-11-29 LAB — TSH: TSH: 1.39 u[IU]/mL (ref 0.27–4.20)

## 2010-11-29 LAB — IGA: IgA: 122 mg/dL (ref 70–400)

## 2010-11-29 LAB — SEDIMENTATION RATE, AUTOMATED: Sedimentation Rate: 8 mm/hr (ref 0–30)

## 2010-11-29 LAB — CK: CK: 128 U/L (ref 34–145)

## 2010-11-29 LAB — CRP: CRP: 2 mg/L (ref 0–10)

## 2010-11-30 LAB — TISSUE TRANSGLUT,IGA: tTG,IgA: 2.5 AB/Units (ref 0.0–19.9)

## 2010-11-30 LAB — ANTI-SSA/SSB
Anti-LA/SS-B: <0.2 AI (ref 0.0–0.9)
Anti-RO/SS-A: <0.2 AI (ref 0.0–0.9)

## 2010-11-30 LAB — HIV-1 AND 2 AB: HIV 1&2 Ab screen: NEGATIVE

## 2010-11-30 LAB — ANTINUCLEAR ANTIBODY SCREEN: ANA Screen: NEGATIVE

## 2010-11-30 LAB — RHEUMATOID FACTOR,SCREEN: Rheumatoid Factor: <10 IU/mL

## 2010-11-30 LAB — ANTI DS-DNA AB: dsDNA Ab: 1 IU/mL (ref 0–4)

## 2010-12-01 LAB — CYCLIC CITRULLINATED PEPTIDE: Cyclic Citrullin Peptide Ab: 0 UNITS (ref 0–19)

## 2010-12-13 LAB — HM DEXA SCAN

## 2011-02-22 ENCOUNTER — Encounter: Payer: Self-pay | Admitting: Orthopedic Surgery

## 2011-02-22 ENCOUNTER — Ambulatory Visit: Payer: Self-pay | Admitting: Orthopedic Surgery

## 2011-02-22 ENCOUNTER — Ambulatory Visit: Payer: Self-pay | Admitting: Rehabilitative and Restorative Service Providers"

## 2011-02-22 DIAGNOSIS — G5600 Carpal tunnel syndrome, unspecified upper limb: Secondary | ICD-10-CM

## 2011-02-22 DIAGNOSIS — M6530 Trigger finger, unspecified finger: Secondary | ICD-10-CM

## 2011-02-22 NOTE — Progress Notes (Signed)
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  The numbness is quite infrequent and usually occurs at night as she sleeps with her arms bent. Symptoms have been ongoing for at least a year.  Since she has started massage and extension exercises with her chiropractor the locking has subsided.  She is here today to discuss any conservative measures we have for her symptoms.  She is referred by her primary care physician Dr. Lonny Prude.    EXAM: She is well and in no distress.  On examination of her hands there is some flattening of the thenars.  Opposition is intact.  She has has a Dupuytren's nodule involving the right ring finger at the level of the distal palmar crease.  There is no flexion contracture were significant according.  The left hand is devoid of palmar fasciitis.  She has a large tender nodules overlying the A1 pulley at each thumb with visible and palpable triggering.  No other triggering digits are identified.  She has a mildly positive Tinel's overlying the median nerve at each wrist with positive provocative maneuvers.  She does not have a positive Tinel's overlying the ulnar nerve at the elbow nor is there appreciable subluxation.    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  The patient was seen and evaluated with Dr. Felipa Evener.  The natural course of carpal tunnel syndrome with triggering digits is explained and nerve conduction studies are recommended.  We briefly discussed the utility of cortisone injections.  She is not interested in injections today but  will schedule the nerve study to be performed.  In the interim she will try wearing wrist splints at night and will obtain these from our hand therapist as she is also interested in extension splints for her thumb triggers.  She is quite adamant that she would like to manage her  symptoms conservatively for as long as possible and is pleased our recommendation for the Dupuytren's nodule in her palm is to watch and wait.  We will see her back for definitive treatment upon completion of the nerve conduction study.

## 2011-02-22 NOTE — Progress Notes (Signed)
Hand Pain     Arm Pain         Review of Systems                 Physical Exam

## 2011-03-03 ENCOUNTER — Encounter: Payer: Self-pay | Admitting: Orthopedic Surgery

## 2011-03-07 ENCOUNTER — Encounter: Payer: Self-pay | Admitting: Primary Care

## 2011-03-07 ENCOUNTER — Ambulatory Visit: Payer: Self-pay | Admitting: Primary Care

## 2011-03-07 VITALS — BP 120/68 | HR 96 | Temp 98.8°F | Ht 63.75 in | Wt 115.0 lb

## 2011-03-07 DIAGNOSIS — F3289 Other specified depressive episodes: Secondary | ICD-10-CM

## 2011-03-07 DIAGNOSIS — K9041 Non-celiac gluten sensitivity: Secondary | ICD-10-CM

## 2011-03-07 MED ORDER — ADDERALL XR 15 MG PO CP24
ORAL_CAPSULE | ORAL | Status: DC
Start: 2011-03-07 — End: 2011-04-26

## 2011-03-07 MED ORDER — ZIPRASIDONE HCL 20 MG PO CAPS *I*
ORAL_CAPSULE | ORAL | Status: DC
Start: 2011-03-07 — End: 2012-03-06

## 2011-03-07 MED ORDER — FLUOXETINE HCL 20 MG PO CAPS *I*
ORAL_CAPSULE | ORAL | Status: DC
Start: 2011-03-07 — End: 2012-06-01

## 2011-03-07 NOTE — Progress Notes (Signed)
HPI:60 year old woman followup or her depression and gluten intolerance. She clearly feels better now one year on a gluten-free diet. She has less fatigue fever body aches and is more alert.    She has also discovered that the best combination of Adderall is for her to take Adderall XR 15 mg 1 in the morning and one around 2 or 3 PM. This seems to work best for her and she is no longer using Adderall 7.5 mg. In the colder weather when she feels totally exhausted and fatigued Adderall XR 20 mg is more helpful    She has been exercising and feels quite well. She still takes nabumetone occasionally for knee pain.    Her mood is excellent. She is enjoying her work and she is having no pain with this warm summer weather and fatigue is much better    Patient Active Problem List   Diagnoses Code   . Allergic Rhinitis 477.9   . Depression 311   . 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 Gluten intolerance 579.0       ALLERGIES:  Amoxicillin; Dairy digestive; Doxycycline hyclate; Egg yolk; No known latex allergy; and Penicillins    Current Outpatient Prescriptions on File Prior to Visit   Medication Sig Dispense Refill   . estrogen conjugated-medroxyPROGESTERone (PREMPRO) 0.625-2.5 MG per tablet Take 1 tablet by mouth daily           . albuterol (PROAIR HFA) 108 (90 BASE) MCG/ACT inhaler INHALE 2 PUFFS EVERY 4-6 HOURS PRN COUGH, WHEEZE, SOB, CHEST TIGHTNESS, SPACED 60 SECONDS APART.  1  0   . nabumetone (RELAFEN) 500 MG tablet TAKE 1 TABLET TWICE DAILY. using prn  60  3   . Cholecalciferol (VITAMIN D) 1000 UNIT tablet TAKE 1 TABLET DAILY.  1  0   . Multiple Vitamin (MULTIVITAMIN) capsule TAKE 1 CAPSULE DAILY.    0   . Calcium 500 MG tablet TAKE 1 TABLET 3 TIMES DAILY    0         Exam:  Filed Vitals:    03/07/11 1619   BP: 120/68   Pulse: 96   Temp: 37.1 C (98.8 F)   Height: 1.619 m (5' 3.75")   Weight: 52.164 kg (115 lb)     Appears well in no distress.  HEENT: No adenopathy in the anterior-posterior cervical chain.  No  clavicular adenopathy.  Oropharynx clear  Neck veins flat.  No thyroid nodules.    Heart: RRR S1-S2 without murmurs  Lungs: Clear to auscultation        1. Gluten intolerance    2. Depression          Doing very well on current medications. Renewed Geodon, Adderall XR 15 mg twice daily. Will followup in October

## 2011-03-17 ENCOUNTER — Encounter: Payer: Self-pay | Admitting: Orthopedic Surgery

## 2011-03-27 ENCOUNTER — Encounter: Payer: Self-pay | Admitting: Physical Medicine and Rehabilitation

## 2011-03-27 ENCOUNTER — Ambulatory Visit: Payer: Self-pay | Admitting: Orthopedic Surgery

## 2011-04-26 ENCOUNTER — Telehealth: Payer: Self-pay | Admitting: Primary Care

## 2011-04-26 ENCOUNTER — Other Ambulatory Visit: Payer: Self-pay | Admitting: Primary Care

## 2011-04-26 MED ORDER — AMPHETAMINE-DEXTROAMPHETAMINE 10 MG PO CP24 *I*
10.0000 mg | ORAL_CAPSULE | Freq: Two times a day (BID) | ORAL | Status: DC
Start: 2011-04-26 — End: 2011-06-01

## 2011-04-26 MED ORDER — AMPHETAMINE-DEXTROAMPHETAMINE 10 MG PO CP24 *I*
10.0000 mg | ORAL_CAPSULE | Freq: Every morning | ORAL | Status: DC
Start: 2011-04-26 — End: 2011-04-26

## 2011-04-26 NOTE — Telephone Encounter (Signed)
Mailed home.

## 2011-04-26 NOTE — Telephone Encounter (Signed)
Spoke to patient, she prefers brand, and ok to mail home to her.

## 2011-04-26 NOTE — Telephone Encounter (Signed)
Brand or generic

## 2011-04-26 NOTE — Telephone Encounter (Signed)
Pt would like to try a lower dose of adderall. She wants to try the 10mg  XR twice a day for a month. She has an appt with you at the beginning of October where she can go over how it went with you.

## 2011-06-01 ENCOUNTER — Ambulatory Visit: Payer: Self-pay | Admitting: Primary Care

## 2011-06-01 ENCOUNTER — Encounter: Payer: Self-pay | Admitting: Primary Care

## 2011-06-01 VITALS — BP 120/60 | HR 64 | Temp 97.8°F | Resp 18 | Ht 63.5 in | Wt 115.0 lb

## 2011-06-01 DIAGNOSIS — F3289 Other specified depressive episodes: Secondary | ICD-10-CM

## 2011-06-01 DIAGNOSIS — K9041 Non-celiac gluten sensitivity: Secondary | ICD-10-CM

## 2011-06-01 DIAGNOSIS — R5383 Other fatigue: Secondary | ICD-10-CM

## 2011-06-01 MED ORDER — AMPHETAMINE-DEXTROAMPHETAMINE 15 MG PO CP24 *I*
ORAL_CAPSULE | ORAL | Status: DC
Start: 2011-06-01 — End: 2011-08-21

## 2011-06-01 NOTE — Progress Notes (Signed)
HPI:60 year old woman follow up for  depression in remission, fatigue. She continues on a gluten-free diet he says this is helping her feel well and it helps fatigue and also helps with any digestive problems.    She consistently feels better in the summer than winter and has done well on Adderall XR 10 mg twice daily for fatigue. She continues on Geodon and Prozac for mood which is working well. She anticipates through the fall and winter she will need a higher dose of Adderall as last year so will renew as Adderall XR 15 mg twice daily.    She is doing light exercise, sleeping well. Appetite is good work has been fine without to much stress. Overall she is a very positive mood    Patient Active Problem List   Diagnoses Code   . Allergic Rhinitis 477.9   . 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 Gluten intolerance 579.0       ALLERGIES:  Amoxicillin; Dairy digestive; Doxycycline hyclate; Egg yolk; No known latex allergy; and Penicillins    Current Outpatient Prescriptions on File Prior to Visit   Medication Sig Dispense Refill   . amphetamine-dextroamphetamine (ADDERALL XR) 20 MG 24 hr capsule Take 20 mg by mouth every morning   Swallow capsules or contents whole. Do not chew.        . ziprasidone (GEODON) 20 MG capsule TAKE 1 CAPSULE DAILY  90 capsule  3   . FLUoxetine (PROZAC) 20 MG capsule TAKE 3 CAPSULE DAILY  270 capsule  3   . albuterol (PROAIR HFA) 108 (90 BASE) MCG/ACT inhaler INHALE 2 PUFFS EVERY 4-6 HOURS PRN COUGH, WHEEZE, SOB, CHEST TIGHTNESS, SPACED 60 SECONDS APART.  1  0   . nabumetone (RELAFEN) 500 MG tablet TAKE 1 TABLET TWICE DAILY. using prn  60  3   . Cholecalciferol (VITAMIN D) 1000 UNIT tablet TAKE 1 TABLET DAILY.  1  0   . Multiple Vitamin (MULTIVITAMIN) capsule TAKE 1 CAPSULE DAILY.    0   . Calcium 500 MG tablet TAKE 1 TABLET 3 TIMES DAILY    0     Medications reviewed, reconciled with patients and changes were made      Exam:  Filed Vitals:    06/01/11 1629   BP:  120/60   Pulse: 64   Temp: 36.6 C (97.8 F)   Resp: 18   Height: 1.613 m (5' 3.5")   Weight: 52.164 kg (115 lb)    Body mass index is 20.05 kg/(m^2).    SpO2 Readings from Last 3 Encounters:   06/01/11 99%   03/07/11 99%   08/30/10 98%       Appears well in no distress.  HEENT: No adenopathy in the anterior-posterior cervical chain.  No clavicular adenopathy.  Oropharynx clear  Neck veins flat.  No thyroid nodules.    Heart: RRR S1-S2 without murmurs  Lungs: Clear to auscultation        1. Gluten intolerance    2. Depression- in remission    3. Fatigue      Depression continues in remission. If he doing well with addition of Adderall dose changes with the change of season. Will increase dose to Adderall XR 15 mg twice daily for the fall and winter.    Followup in January

## 2011-07-25 ENCOUNTER — Ambulatory Visit: Payer: Self-pay | Admitting: Primary Care

## 2011-07-25 ENCOUNTER — Encounter: Payer: Self-pay | Admitting: Primary Care

## 2011-07-25 VITALS — BP 134/80 | HR 82 | Temp 97.8°F | Resp 18 | Ht 64.0 in | Wt 118.0 lb

## 2011-07-25 DIAGNOSIS — S91209A Unspecified open wound of unspecified toe(s) with damage to nail, initial encounter: Secondary | ICD-10-CM

## 2011-07-25 NOTE — Progress Notes (Signed)
HPI:60 year old woman comes in with concerns of a deformed left first toenail present for the past 6 months. She denies any pain. Is not aware of any trauma    Patient Active Problem List   Diagnoses Code   . Allergic Rhinitis 477.9   . 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 Gluten intolerance 579.0   . Fatigue 780.79       ALLERGIES:  Amoxicillin; Dairy digestive; Doxycycline hyclate; Egg yolk; Penicillins; and No known latex allergy    Current Outpatient Prescriptions on File Prior to Visit   Medication Sig Dispense Refill   . estrogen conjugated-medroxyPROGESTERone (PREMPRO) 0.3-1.5 MG per tablet Take 1 tablet by mouth daily           . amphetamine-dextroamphetamine (ADDERALL XR) 15 MG 24 hr capsule May sprinkle contents of capsule on food, do not chew entire capsule. Take one pill twice a day. CodeB  180 capsule  0   . amphetamine-dextroamphetamine (ADDERALL XR) 20 MG 24 hr capsule Take 20 mg by mouth every morning   Swallow capsules or contents whole. Do not chew.        . ziprasidone (GEODON) 20 MG capsule TAKE 1 CAPSULE DAILY  90 capsule  3   . FLUoxetine (PROZAC) 20 MG capsule TAKE 3 CAPSULE DAILY  270 capsule  3   . albuterol (PROAIR HFA) 108 (90 BASE) MCG/ACT inhaler INHALE 2 PUFFS EVERY 4-6 HOURS PRN COUGH, WHEEZE, SOB, CHEST TIGHTNESS, SPACED 60 SECONDS APART.  1  0   . nabumetone (RELAFEN) 500 MG tablet TAKE 1 TABLET TWICE DAILY. using prn  60  3   . Cholecalciferol (VITAMIN D) 1000 UNIT tablet TAKE 1 TABLET DAILY.  1  0   . Multiple Vitamin (MULTIVITAMIN) capsule TAKE 1 CAPSULE DAILY.    0   . Calcium 500 MG tablet TAKE 1 TABLET 3 TIMES DAILY    0     Medications reviewed, reconciled with patients and changes were made      Exam:  Filed Vitals:    07/25/11 1211   BP: 134/80   Pulse: 82   Temp: 36.6 C (97.8 F)   Resp: 18   Height: 1.626 m (5\' 4" )   Weight: 53.524 kg (118 lb)    Body mass index is 20.25 kg/(m^2).    SpO2 Readings from Last 3 Encounters:   07/25/11 99%    06/01/11 99%   03/07/11 99%       Examination of her toe nail on the left first toe suggests remote trauma. There is ridging of the nail transversely without thickening. There is evidence of the prior nail having peeled off the top as a new nail grew in underneath. There appears to be damage to the distal nail bed with separation of the left one half toe nail from the skin.          1. Traumatic avulsion of nail plate of toe      Deformed nail appears to be more traumatic and not fungal. Advised this may take another 6 months to completely grow out and looked normal, however I am concerned because the nail has detached from the  distal nail plate This will be permanently misshapen and deformed

## 2011-08-17 ENCOUNTER — Telehealth: Payer: Self-pay | Admitting: Primary Care

## 2011-08-17 ENCOUNTER — Encounter: Payer: Self-pay | Admitting: Primary Care

## 2011-08-21 MED ORDER — AMPHETAMINE-DEXTROAMPHETAMINE 10 MG PO CP24 *I*
ORAL_CAPSULE | ORAL | Status: DC
Start: 2011-08-21 — End: 2011-09-21

## 2011-09-04 ENCOUNTER — Telehealth: Payer: Self-pay | Admitting: Primary Care

## 2011-09-21 ENCOUNTER — Other Ambulatory Visit: Payer: Self-pay | Admitting: Primary Care

## 2011-09-21 MED ORDER — AMPHETAMINE-DEXTROAMPHETAMINE 10 MG PO CP24 *I*
ORAL_CAPSULE | ORAL | Status: DC
Start: 2011-09-21 — End: 2011-11-27

## 2011-10-18 ENCOUNTER — Other Ambulatory Visit: Payer: Self-pay | Admitting: Primary Care

## 2011-10-18 DIAGNOSIS — Z0000 Encounter for general adult medical examination without abnormal findings: Secondary | ICD-10-CM

## 2011-10-20 ENCOUNTER — Encounter: Payer: Self-pay | Admitting: Primary Care

## 2011-11-21 ENCOUNTER — Encounter: Payer: Self-pay | Admitting: Primary Care

## 2011-11-27 ENCOUNTER — Other Ambulatory Visit: Payer: Self-pay | Admitting: Primary Care

## 2011-11-27 MED ORDER — AMPHETAMINE-DEXTROAMPHETAMINE 10 MG PO CP24 *I*
ORAL_CAPSULE | ORAL | Status: DC
Start: 2011-11-27 — End: 2011-12-05

## 2011-11-27 NOTE — Telephone Encounter (Signed)
[facility] & [facility]

## 2011-11-28 NOTE — Telephone Encounter (Signed)
Faxed and mailed.

## 2011-12-05 ENCOUNTER — Telehealth: Payer: Self-pay

## 2011-12-05 MED ORDER — AMPHETAMINE-DEXTROAMPHETAMINE 10 MG PO CP24 *I*
ORAL_CAPSULE | ORAL | Status: DC
Start: 2011-12-05 — End: 2012-03-06

## 2011-12-05 MED ORDER — AMPHETAMINE-DEXTROAMPHETAMINE 20 MG PO CP24 *I*
20.0000 mg | ORAL_CAPSULE | Freq: Every morning | ORAL | Status: DC
Start: 2011-12-05 — End: 2012-07-16

## 2011-12-05 MED ORDER — AMPHETAMINE-DEXTROAMPHETAMINE 10 MG PO CP24 *I*
ORAL_CAPSULE | ORAL | Status: DC
Start: 2011-12-05 — End: 2011-12-05

## 2011-12-05 NOTE — Telephone Encounter (Signed)
Done

## 2011-12-05 NOTE — Telephone Encounter (Signed)
Patient notified, and mailed scripts

## 2011-12-05 NOTE — Addendum Note (Signed)
Addended by: Saddie Benders on: 12/05/2011 02:17 PM     Modules accepted: Orders

## 2011-12-05 NOTE — Telephone Encounter (Signed)
Jackie Moran called because [facility] never received her Amphetamine-dextramphetamine prescription that was ordered on 11/27/11. I spoke to [place] at [facility] and because this is a Class 2 medication it can't be faxed and needs a hard copy mailed to [facility] [id number] [address], [place] [address]. Can you redo Jackie Moran's prescription and mail the hard copy to this address?  Jackie Moran has enough medication to wait for this order.

## 2011-12-05 NOTE — Telephone Encounter (Signed)
error 

## 2011-12-08 ENCOUNTER — Other Ambulatory Visit: Payer: Self-pay | Admitting: Primary Care

## 2011-12-08 DIAGNOSIS — Z0000 Encounter for general adult medical examination without abnormal findings: Secondary | ICD-10-CM

## 2011-12-20 ENCOUNTER — Encounter: Payer: Self-pay | Admitting: Ophthalmology

## 2011-12-20 ENCOUNTER — Telehealth: Payer: Self-pay | Admitting: Ophthalmology

## 2011-12-20 ENCOUNTER — Ambulatory Visit: Payer: Self-pay | Admitting: Ophthalmology

## 2011-12-20 DIAGNOSIS — H43819 Vitreous degeneration, unspecified eye: Secondary | ICD-10-CM

## 2011-12-20 NOTE — Patient Instructions (Signed)
Posterior vitreous detachment

## 2011-12-20 NOTE — Telephone Encounter (Signed)
Ophthalmology - Triage Form       Version 1.0    Date: 12/20/2011    Time: 12:21 PM   Operator: Ulice Dash     Symptoms:     OD  (Right) OS   (Left) OU   (Both)   Which Eye: X     When did the problem begin: 3WKS/2DAYS     Pain, if yes (0=no pain, 10=worst pain 0     Eye Surgery:                   Demetrio Lapping for positive symptoms       Eye or head trauma      Intermittent visual loss      Sudden visual loss   X   Blurry Vision   X 3weeks  OD   Flashes   X 2days OD   Floaters   X   Spots   X   Light Sensitivity      Redness      Itchy      Discharge      Swelling        Patient Requests to speak with provider directly     Additional Information:  The patient states she saw an Optometrist last week with no success.  She has no notes.  She is worried about a detached retina.  Please call patient with an appt for today if possible.  She can be reached at [phone].

## 2012-01-15 ENCOUNTER — Ambulatory Visit
Admit: 2012-01-15 | Discharge: 2012-01-15 | Disposition: A | Discharge status: Home / Self Care | Payer: Self-pay | Source: Ambulatory Visit | Attending: Primary Care | Admitting: Primary Care

## 2012-01-15 DIAGNOSIS — Z0000 Encounter for general adult medical examination without abnormal findings: Secondary | ICD-10-CM

## 2012-01-15 LAB — COMPREHENSIVE METABOLIC PANEL
ALT: 16 U/L (ref 0–35)
AST: 25 U/L (ref 0–35)
Albumin: 4.5 g/dL (ref 3.5–5.2)
Alk Phos: 67 U/L (ref 35–105)
Anion Gap: 10 (ref 7–16)
Bilirubin,Total: 0.4 mg/dL (ref 0.0–1.2)
CO2: 27 mmol/L (ref 20–28)
Calcium: 9 mg/dL (ref 8.6–10.2)
Chloride: 104 mmol/L (ref 96–108)
Creatinine: 0.91 mg/dL (ref 0.51–0.95)
GFR,Black: 80 *
GFR,Caucasian: 70 *
Glucose: 89 mg/dL (ref 60–99)
Lab: 16 mg/dL (ref 6–20)
Potassium: 4.6 mmol/L (ref 3.3–5.1)
Sodium: 141 mmol/L (ref 133–145)
Total Protein: 6.4 g/dL (ref 6.3–7.7)

## 2012-01-15 LAB — CBC AND DIFFERENTIAL
Baso # K/uL: 0 10*3/uL (ref 0.0–0.1)
Basophil %: 1 % (ref 0.1–1.2)
Eos # K/uL: 0.1 10*3/uL (ref 0.0–0.4)
Eosinophil %: 3 % (ref 0.7–5.8)
Hematocrit: 42 % (ref 34–45)
Hemoglobin: 13.7 g/dL (ref 11.2–15.7)
Lymph # K/uL: 1 10*3/uL — ABNORMAL LOW (ref 1.2–3.7)
Lymphocyte %: 24.6 % (ref 19.3–51.7)
MCV: 91 fL (ref 79–95)
Mono # K/uL: 0.5 10*3/uL (ref 0.2–0.9)
Monocyte %: 11.4 % (ref 4.7–12.5)
Neut # K/uL: 2.4 10*3/uL (ref 1.6–6.1)
Platelets: 219 10*3/uL (ref 160–370)
RBC: 4.6 MIL/uL (ref 3.9–5.2)
RDW: 13.6 % (ref 11.7–14.4)
Seg Neut %: 60 % (ref 34.0–71.1)
WBC: 4 10*3/uL (ref 4.0–10.0)

## 2012-01-15 LAB — LIPID PANEL
Chol/HDL Ratio: 2
Cholesterol: 180 mg/dL
HDL: 90 mg/dL
LDL Calculated: 75 mg/dL
Non HDL Cholesterol: 90 mg/dL
Triglycerides: 73 mg/dL

## 2012-01-15 LAB — URINALYSIS WITH REFLEX TO MICROSCOPIC
Blood,UA: NEGATIVE
Ketones, UA: NEGATIVE
Leuk Esterase,UA: NEGATIVE
Nitrite,UA: NEGATIVE
Protein,UA: NEGATIVE mg/dL
Specific Gravity,UA: 1.026 (ref 1.002–1.030)
pH,UA: 5 (ref 5.0–8.0)

## 2012-01-15 LAB — TSH: TSH: 0.8 u[IU]/mL (ref 0.27–4.20)

## 2012-01-16 LAB — HEMOGLOBIN A1C: Hemoglobin A1C: 5.3 % (ref 4.0–6.0)

## 2012-01-17 LAB — VITAMIN D
25-OH VIT D2: <4 ng/mL
25-OH VIT D3: 37 ng/mL
25-OH Vit Total: 37 ng/mL (ref 30–80)

## 2012-01-24 ENCOUNTER — Ambulatory Visit: Payer: Self-pay | Admitting: Ophthalmology

## 2012-01-24 ENCOUNTER — Encounter: Payer: Self-pay | Admitting: Ophthalmology

## 2012-01-24 DIAGNOSIS — H43819 Vitreous degeneration, unspecified eye: Secondary | ICD-10-CM

## 2012-01-25 ENCOUNTER — Encounter: Payer: Self-pay | Admitting: Primary Care

## 2012-01-25 ENCOUNTER — Ambulatory Visit: Payer: Self-pay | Admitting: Primary Care

## 2012-01-25 ENCOUNTER — Encounter: Payer: Self-pay | Admitting: Gastroenterology

## 2012-01-25 VITALS — BP 100/58 | HR 77 | Temp 98.3°F | Ht 63.75 in | Wt 117.0 lb

## 2012-01-25 DIAGNOSIS — Z0000 Encounter for general adult medical examination without abnormal findings: Secondary | ICD-10-CM

## 2012-01-25 DIAGNOSIS — K9041 Non-celiac gluten sensitivity: Secondary | ICD-10-CM

## 2012-01-25 DIAGNOSIS — F3289 Other specified depressive episodes: Secondary | ICD-10-CM

## 2012-01-26 NOTE — H&P (Signed)
Jackie Moran is a 61 y.o. female Comes in for complete physical exam her depression remains in remission. She remains on a gluten-free diet and feels quite well. Her weight has been stable.    She currently is not in any relationship but does have good friends. She has a dog and she walks the dog 5 times per week.    Her fatigue is doing quite well and she balances doses of Adderall        Problem List:  Patient Active Problem List   Diagnosis Code   . Allergic Rhinitis 477.9   . 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 Gluten intolerance 579.0   . Fatigue 780.79         Allergies:  Amoxicillin; Dairy digestive; Doxycycline hyclate; Egg yolk; Gluten meal; Penicillins; and No known latex allergy       Medications:  Current Outpatient Prescriptions on File Prior to Visit   Medication Sig Dispense Refill   . amphetamine-dextroamphetamine (ADDERALL XR) 20 MG 24 hr capsule Take 1 capsule (20 mg total) by mouth every morning   Swallow capsules or contents whole.  Take 20 mg by mouth every morning  Code B MDD 1  90 capsule  0   . amphetamine-dextroamphetamine (ADDERALL XR) 10 MG 24 hr capsule May sprinkle contents of capsule on food, do not chew entire capsule. Take one twice a day.  Code B  MDD 2pills  180 capsule  0   . estrogen conjugated-medroxyPROGESTERone (PREMPRO) 0.3-1.5 MG per tablet Take 1 tablet by mouth daily           . ziprasidone (GEODON) 20 MG capsule TAKE 1 CAPSULE DAILY  90 capsule  3   . FLUoxetine (PROZAC) 20 MG capsule TAKE 3 CAPSULE DAILY  270 capsule  3   . albuterol (PROAIR HFA) 108 (90 BASE) MCG/ACT inhaler INHALE 2 PUFFS EVERY 4-6 HOURS PRN COUGH, WHEEZE, SOB, CHEST TIGHTNESS, SPACED 60 SECONDS APART.  1  0   . EXPIRED: amphetamine-dextroamphetamine (ADDERALL XR) 20 MG 24 hr capsule TAKE 1 CAPSULE DAILY.  30  0   . EXPIRED: estrogen conjugated-medroxyPROGESTERone (PREMPRO) 0.3-1.5 MG per tablet Take  by mouth.  84  0   . DISCONTD: nabumetone (RELAFEN) 500 MG tablet  TAKE 1 TABLET TWICE DAILY. using prn  60  3   . EXPIRED: amphetamine-dextroamphetamine (ADDERALL) 7.5 MG tablet TAKE 1 TABLET 3 TIMES DAILY code B  270  0   . EXPIRED: estrogen, conjugated,-medroxyprogesterone (PREMPRO) 0.45-1.5 MG per tablet TAKE 1 TABLET DAILY.    0   . Cholecalciferol (VITAMIN D) 1000 UNIT tablet TAKE 1 TABLET DAILY.  1  0   . Multiple Vitamin (MULTIVITAMIN) capsule TAKE 1 CAPSULE DAILY.    0   . DISCONTD: Calcium 500 MG tablet TAKE 1 TABLET 3 TIMES DAILY    0     No current facility-administered medications on file prior to visit.     Medications reviewed, reconciled with patients and changes were made          Immunizations:  Immunization History   Administered Date(s) Administered   . Td 08/28/1986   . Tdap 12/06/2006         Social History:  History     Social History   . Marital Status: Divorced     Spouse Name: N/A     Number of Children: N/A   . Years of Education: N/A  Occupational History   . Not on file.     Social History Main Topics   . Smoking status: Never Smoker    . Smokeless tobacco: Not on file   . Alcohol Use: No   . Drug Use: No   . Sexually Active: Not Currently     Other Topics Concern   . Not on file     Social History Narrative    Family Hx      Father age 8 has asthma and has had an MI. Bikes and walks,    Mother age 11 has died. She had a degenerative neurologic disease     Brother age 69 has hyperlipidemia,             Personal Hx     Divorced for 24 years. She's had no children.     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               Health Maintenance:  Health Maintenance   Topic Date Due   . Breast Cancer Screening  12/13/2011   . Dxa-osteopenia-every 3 Years  12/12/2013   . Colon Cancer Screening 10 Year Colonoscopy  11/09/2014   . Hiv Testing Offered  Completed         WUJ:WJXBJYN Health: No changes in appetite or weight. No fever, chills, or sweats. No fatigue or pain.   Skin: No sores or rashes. No changes in hair or nails.    Bleeding/immunity: No enlarged glands. No abnormal bleeding.   Endocrine: No heat or cold intolerance. No excessive thirst or urination.   Musculoskeletal: No muscle weakness or pain. No joint swelling or pain.   EENT:  No loss of hearing or ringing in ears. No sore throat or hoarseness.   Head and neck: No headache, dizziness, or lightheadedness.   Heart and lung: No chest pain, SOB, palpitations. No wheezing, cough, or sputum. No leg swelling or pain with walking.   GI: No trouble swallowing or heartburn. No abdominal pain. No nausea or vomiting. No constipation or diarrhea.   Urinary: No increased frequency, burning, urgency, or incontinence. No blood in urine.   Neuropsych: No numbness or tingling. No loss of balance, or trouble walking. No seizures. No depression, anxiety, or suicidal thoughts. No poor judgment, memory, or attention.No fainting or loss of consciousness.       Physical Exam:  Filed Vitals:    01/25/12 1107   BP: 100/58   Pulse: 77   Temp: 36.8 C (98.3 F)   Height: 1.619 m (5' 3.75")   Weight: 53.071 kg (117 lb)     Body mass index is 20.25 kg/(m^2).   SpO2 Readings from Last 3 Encounters:   01/25/12 98%   07/25/11 99%   06/01/11 99%     GENERAL:  Alert and oriented; no acute distress  SKIN: Normal, no  lesions consistent with skin cancer or melanoma  HEAD:  normal  EYES:   --lids:  normal  --conjunctiva:  normal  --pupils:  normal  --sclera:  normal  --EOM:  normal  EARS, NOSE, THROAT:  --ext. ear:  normal  --T.M.'s:  normal  --nares:  normal  --teeth:  normal  --gums:  normal  --tonsils:  normal  --throat:  normal  NECK:  --ROM:  normal  --nodes:  negative  --carotids: normal, JVP flat  --bruits:  none heard   --thyroid:  normal, no nodules  HEART:  RRR, S1S2, no  m/g/r  LUNGS:  clear bilaterally, no rales, rhonchi, crackles  CHEST:  normal  BREASTS:    --skin:  normal  --masses:  none  --nipples:  normal  --discharge:  none  --nodes:  none  BACK:  normal, no kyphosis or scoliosis  LYMPH: No  cervical, clavicular, axilla, or inguinal adenopathy  ABDOMEN:    --softness:  normal  --tenderness:  non-tender  --distension:  none  --organs:  no HSM  --bruits:  none  --pulsatile mass:  none  RECTAL:  Not indicated  GENITALIA: deferred to GYN  EXTREMITIES:  --edema:  none  --pulses:  normal, dorsalis pedis posterior tibial pulses 2+ bilaterally  --joints:  normal  --nails:   normal    NEUROLOGIC:  Nonfocal exam, no abnormalities              12 LEAD ELECTROCARDIOGRAM:  Normal sinus rhythm, rate: 67 , axis 93   : PR, QRS intervals normal;ST T-wave segments normal no ischemia.  Impression:  Normal ECG      Labs:  Recent Results (from the past 336 hour(s))   LIPID PANEL    Collection Time    01/15/12  8:20 AM       Result Value Range    Cholesterol 180      Triglycerides 73      HDL 90      LDL Calculated 75      Non HDL Cholesterol 90      Chol/HDL Ratio 2.0     COMPREHENSIVE METABOLIC PANEL    Collection Time    01/15/12  8:20 AM       Result Value Range    Sodium 141  133 - 145 mmol/L    Potassium 4.6  3.3 - 5.1 mmol/L    Chloride 104  96 - 108 mmol/L    CO2 27  20 - 28 mmol/L    Anion Gap 10  7 - 16    UN 16  6 - 20 mg/dL    Creatinine 1.61  0.96 - 0.95 mg/dL    GFR,Caucasian 70      GFR,Black 80      Glucose 89  60 - 99 mg/dL    Calcium 9.0  8.6 - 04.5 mg/dL    Total Protein 6.4  6.3 - 7.7 g/dL    Albumin 4.5  3.5 - 5.2 g/dL    Bilirubin,Total 0.4  0.0 - 1.2 mg/dL    AST 25  0 - 35 U/L    ALT 16  0 - 35 U/L    Alk Phos 67  35 - 105 U/L   CBC AND DIFFERENTIAL    Collection Time    01/15/12  8:20 AM       Result Value Range    WBC 4.0  4.0 - 10.0 THOU/uL    RBC 4.6  3.9 - 5.2 MIL/uL    Hemoglobin 13.7  11.2 - 15.7 g/dL    Hematocrit 42  34 - 45 %    MCV 91  79 - 95 fL    RDW 13.6  11.7 - 14.4 %    Platelets 219  160 - 370 THOU/uL    Seg Neut % 60.0  34.0 - 71.1 %    Lymphocyte % 24.6  19.3 - 51.7 %    Monocyte % 11.4  4.7 - 12.5 %    Eosinophil % 3.0  0.7 - 5.8 %    Basophil % 1.0  0.1 -  1.2 %    Neut # K/uL 2.4   1.6 - 6.1 THOU/uL    Lymph # K/uL 1.0 (*) 1.2 - 3.7 THOU/uL    Mono # K/uL 0.5  0.2 - 0.9 THOU/uL    Eos # K/uL 0.1  0.0 - 0.4 THOU/uL    Baso # K/uL 0.0  0.0 - 0.1 THOU/uL   URINALYSIS WITH REFLEX TO MICROSCOPIC    Collection Time    01/15/12  8:20 AM       Result Value Range    Color, UA Amber  Yellow    Appearance,UR 2+ Cloudy (*) Clear    Specific Gravity,UA 1.026  1.002 - 1.030    Leuk Esterase,UA NEG  NEGATIVE    Nitrite,UA NEG  NEGATIVE    pH,UA 5.0  5.0 - 8.0    Protein,UA NEG  NEGATIVE mg/dL    Glucose,UA NORM      Ketones, UA NEG  NEGATIVE    Blood,UA NEG  NEGATIVE   VITAMIN D 25 HYDROXY, D2&D3    Collection Time    01/15/12  8:20 AM       Result Value Range    25-OH VIT D2 <4      25-OH VIT D3 37      25-OH Vit Total 37  30 - 80 ng/mL   HEMOGLOBIN A1C    Collection Time    01/15/12  8:20 AM       Result Value Range    Hemoglobin A1C 5.3  4.0 - 6.0 %   TSH    Collection Time    01/15/12  8:20 AM       Result Value Range    TSH 0.80  0.27 - 4.20 uIU/mL              1. Depression- in remission     2. Gluten intolerance     3. Preventative health care  EKG 12 lead     61 year old woman doing quite well. Her depression remains in remission treated with Prozac Geodon and dexamphetamine.    Weight is stable. Normal hemoglobin A1c, lipid profile. Normal TSH.    She is scheduled for a mammogram. Up-to-date with DEXA scan and immunizations. Up-to-date with colonoscopy.    Followup in 6 months

## 2012-03-06 ENCOUNTER — Other Ambulatory Visit: Payer: Self-pay | Admitting: Primary Care

## 2012-03-06 MED ORDER — ZIPRASIDONE HCL 20 MG PO CAPS *I*
ORAL_CAPSULE | ORAL | Status: DC
Start: 2012-03-06 — End: 2013-04-29

## 2012-03-06 MED ORDER — AMPHETAMINE-DEXTROAMPHETAMINE 10 MG PO CP24 *I*
ORAL_CAPSULE | ORAL | Status: DC
Start: 2012-03-06 — End: 2012-07-23

## 2012-03-06 NOTE — Telephone Encounter (Signed)
Mail to patient.

## 2012-03-07 NOTE — Telephone Encounter (Signed)
Mailed home.

## 2012-06-01 ENCOUNTER — Other Ambulatory Visit: Payer: Self-pay | Admitting: Primary Care

## 2012-06-19 ENCOUNTER — Encounter: Payer: Self-pay | Admitting: Gastroenterology

## 2012-06-19 LAB — HM MAMMOGRAPHY

## 2012-06-21 ENCOUNTER — Encounter: Payer: Self-pay | Admitting: Primary Care

## 2012-07-10 ENCOUNTER — Ambulatory Visit: Payer: Self-pay | Admitting: Ophthalmology

## 2012-07-10 VITALS — BP 156/94 | Ht 63.5 in | Wt 115.0 lb

## 2012-07-10 DIAGNOSIS — H43819 Vitreous degeneration, unspecified eye: Secondary | ICD-10-CM | POA: Insufficient documentation

## 2012-07-10 NOTE — Progress Notes (Addendum)
HPI    6 month f/u - PVD OD  (rs)    Slight decrease vision at near x 2 months. Grabbing for glasses more.   Persistent flashes temporally OD-stable. Intermittent floaters and blobby   stuff that floats around OD-occasionally will block vision. When driving   at night, lights have "really long tails" for over 6 months (since before   her last visit).        Base Eye Exam       Visual Acuity    Right Left   Dist cc 20/20 -1 20/20 -1   Method: Snellen - Linear   Tonometry    Right Left   Pressure 12 12   Method: Tonopen   Time: 4:16 PM   Pupils    React APD   Right Brisk None   Left Brisk None   Visual Fields    Left Right   Result Full Full   Extraocular Movement    Right Left   Result Full, Ortho Full, Ortho   Neuro/Psych   Oriented x3: Yes   Mood/Affect: Normal   Dilation   Both eyes: 2.5% Phenylephrine, 1.0% Tropicamide @ 4:17 PM      Slit Lamp and Fundus Exam       External Exam    Right Left   External Normal ocular adnexae, lacrimal gland & drainage, orbits Normal ocular adnexae, lacrimal gland & drainage, orbits   Slit Lamp Exam    Right Left   Lids/Lashes Normal structure & position Normal structure & position   Conjunctiva/Sclera Normal bulbar/palpebral, conjunctiva, sclera Normal bulbar/palpebral, conjunctiva, sclera   Cornea Normal epithelium, stroma, endothelium, tear film Normal epithelium, stroma, endothelium, tear film   Anterior Chamber Clear & deep Clear & deep   Iris Normal shape, size, morphology Normal shape, size, morphology   Lens Normal cortex, nucleus, anterior/posterior capsule, clarity Normal cortex, nucleus, anterior/posterior capsule, clarity   Fundus Exam    Right Left   Vitreous PVD with floaters few floaters no PVD   Disc Normal size, appearance, nerve fiber layer Normal size, appearance, nerve fiber layer   Macula Normal Normal   Vessels Normal Normal   Periphery all attached no tears no retinal tears          Posterior vitreous detachment  Still with symptomatic floaters but no  retinal tears and excellent visual acuity  Do not recommend surgical intervention  F/u 1 year, sooner prn

## 2012-07-10 NOTE — Assessment & Plan Note (Signed)
Still with symptomatic floaters but no retinal tears and excellent visual acuity  Do not recommend surgical intervention  F/u 1 year, sooner prn

## 2012-07-11 ENCOUNTER — Encounter: Payer: Self-pay | Admitting: Emergency Medicine

## 2012-07-11 ENCOUNTER — Ambulatory Visit: Payer: Self-pay | Admitting: Primary Care

## 2012-07-11 ENCOUNTER — Inpatient Hospital Stay
Admission: EM | Admit: 2012-07-11 | Disposition: A | Discharge status: Home / Self Care | Payer: Self-pay | Source: Ambulatory Visit | Attending: Pulmonology | Admitting: Pulmonology

## 2012-07-11 ENCOUNTER — Encounter: Payer: Self-pay | Admitting: Primary Care

## 2012-07-11 VITALS — BP 150/70 | HR 98 | Temp 99.2°F | Ht 63.75 in | Wt 117.6 lb

## 2012-07-11 DIAGNOSIS — R042 Hemoptysis: Principal | ICD-10-CM | POA: Diagnosis present

## 2012-07-11 LAB — COMPREHENSIVE METABOLIC PANEL
ALT: 17 U/L (ref 0–35)
ALT: 21 U/L (ref 0–35)
AST: 21 U/L (ref 0–35)
AST: 28 U/L (ref 0–35)
Albumin: 4.8 g/dL (ref 3.5–5.2)
Albumin: 4.9 g/dL (ref 3.5–5.2)
Alk Phos: 77 U/L (ref 35–105)
Alk Phos: 78 U/L (ref 35–105)
Anion Gap: 13 (ref 7–16)
Anion Gap: 12 (ref 7–16)
Bilirubin,Total: 0.5 mg/dL (ref 0.0–1.2)
Bilirubin,Total: 0.6 mg/dL (ref 0.0–1.2)
CO2: 27 mmol/L (ref 20–28)
CO2: 24 mmol/L (ref 20–28)
Calcium: 9.3 mg/dL (ref 8.6–10.2)
Calcium: 9.4 mg/dL (ref 8.6–10.2)
Chloride: 100 mmol/L (ref 96–108)
Chloride: 104 mmol/L (ref 96–108)
Creatinine: 0.78 mg/dL (ref 0.51–0.95)
Creatinine: 0.71 mg/dL (ref 0.51–0.95)
GFR,Black: 96 *
GFR,Black: 108 *
GFR,Caucasian: 83 *
GFR,Caucasian: 94 *
Glucose: 171 mg/dL — ABNORMAL HIGH (ref 60–99)
Glucose: 101 mg/dL — ABNORMAL HIGH (ref 60–99)
Lab: 14 mg/dL (ref 6–20)
Lab: 15 mg/dL (ref 6–20)
Potassium: 3.7 mmol/L (ref 3.3–5.1)
Potassium: 4.3 mmol/L (ref 3.3–5.1)
Sodium: 140 mmol/L (ref 133–145)
Sodium: 140 mmol/L (ref 133–145)
Total Protein: 6.7 g/dL (ref 6.3–7.7)
Total Protein: 7 g/dL (ref 6.3–7.7)

## 2012-07-11 LAB — CBC AND DIFFERENTIAL
Baso # K/uL: 0 10*3/uL (ref 0.0–0.1)
Baso # K/uL: 0 10*3/uL (ref 0.0–0.1)
Basophil %: 0.1 % (ref 0.1–1.2)
Basophil %: 0.1 % (ref 0.1–1.2)
Eos # K/uL: 0 10*3/uL (ref 0.0–0.4)
Eos # K/uL: 0 10*3/uL (ref 0.0–0.4)
Eosinophil %: 0.2 % — ABNORMAL LOW (ref 0.7–5.8)
Eosinophil %: 0.1 % — ABNORMAL LOW (ref 0.7–5.8)
Hematocrit: 40 % (ref 34–45)
Hematocrit: 40 % (ref 34–45)
Hemoglobin: 13.2 g/dL (ref 11.2–15.7)
Hemoglobin: 13.4 g/dL (ref 11.2–15.7)
Lymph # K/uL: 0.7 10*3/uL — ABNORMAL LOW (ref 1.2–3.7)
Lymph # K/uL: 0.7 10*3/uL — ABNORMAL LOW (ref 1.2–3.7)
Lymphocyte %: 4.5 % — ABNORMAL LOW (ref 19.3–51.7)
Lymphocyte %: 4.2 % — ABNORMAL LOW (ref 19.3–51.7)
MCV: 91 fL (ref 79–95)
MCV: 92 fL (ref 79–95)
Mono # K/uL: 1 10*3/uL — ABNORMAL HIGH (ref 0.2–0.9)
Mono # K/uL: 1 10*3/uL — ABNORMAL HIGH (ref 0.2–0.9)
Monocyte %: 6.4 % (ref 4.7–12.5)
Monocyte %: 6.2 % (ref 4.7–12.5)
Neut # K/uL: 13.5 10*3/uL — ABNORMAL HIGH (ref 1.6–6.1)
Neut # K/uL: 14 10*3/uL — ABNORMAL HIGH (ref 1.6–6.1)
Platelets: 190 10*3/uL (ref 160–370)
Platelets: 180 10*3/uL (ref 160–370)
RBC: 4.3 MIL/uL (ref 3.9–5.2)
RBC: 4.4 MIL/uL (ref 3.9–5.2)
RDW: 13.4 % (ref 11.7–14.4)
RDW: 13.7 % (ref 11.7–14.4)
Seg Neut %: 88.8 % — ABNORMAL HIGH (ref 34.0–71.1)
Seg Neut %: 89.4 % — ABNORMAL HIGH (ref 34.0–71.1)
WBC: 15.3 10*3/uL — ABNORMAL HIGH (ref 4.0–10.0)
WBC: 15.7 10*3/uL — ABNORMAL HIGH (ref 4.0–10.0)

## 2012-07-11 LAB — PROTIME-INR
INR: 1.1 (ref 1.0–1.2)
Protime: 11 s (ref 9.2–12.3)

## 2012-07-11 LAB — TYPE AND SCREEN
ABO RH Blood Type: A POS
Antibody Screen: NEGATIVE

## 2012-07-11 LAB — APTT: aPTT: 31.7 s (ref 25.8–37.9)

## 2012-07-11 LAB — POCT GLUCOSE: Glucose POCT: 114 mg/dL — ABNORMAL HIGH (ref 60–99)

## 2012-07-11 MED ORDER — CHOLECALCIFEROL 1000 UNIT PO CAPS *WRAPPED*
1000.0000 [IU] | ORAL_CAPSULE | Freq: Every day | ORAL | Status: DC
Start: 2012-07-12 — End: 2012-07-16
  Administered 2012-07-13 – 2012-07-16 (×4): 1000 [IU] via ORAL
  Filled 2012-07-11 (×5): qty 1

## 2012-07-11 MED ORDER — MOXIFLOXACIN HCL IN NACL 400 MG/250ML IV SOLN *WRAPPED* *I*
400.0000 mg | INTRAVENOUS | Status: DC
Start: 2012-07-11 — End: 2012-07-13
  Administered 2012-07-11 – 2012-07-12 (×2): 400 mg via INTRAVENOUS
  Filled 2012-07-11 (×3): qty 250

## 2012-07-11 MED ORDER — AMPHETAMINE-DEXTROAMPHETAMINE 10 MG PO CP24 *I*
10.0000 mg | ORAL_CAPSULE | Freq: Every morning | ORAL | Status: DC
Start: 2012-07-12 — End: 2012-07-14
  Administered 2012-07-14: 10 mg via ORAL
  Filled 2012-07-11 (×3): qty 1

## 2012-07-11 MED ORDER — FLUOXETINE HCL 20 MG PO CAPS *I*
20.0000 mg | ORAL_CAPSULE | Freq: Every day | ORAL | Status: DC
Start: 2012-07-11 — End: 2012-07-11

## 2012-07-11 MED ORDER — IBUPROFEN 400 MG PO TABS *I*
400.0000 mg | ORAL_TABLET | Freq: Four times a day (QID) | ORAL | Status: DC | PRN
Start: 2012-07-11 — End: 2012-07-11

## 2012-07-11 MED ORDER — CODEINE SULFATE 15 MG PO TABS *I*
15.0000 mg | ORAL_TABLET | ORAL | Status: DC | PRN
Start: 2012-07-11 — End: 2012-07-11

## 2012-07-11 MED ORDER — MULTI-VITAMINS PO TABS *I*
1.0000 | ORAL_TABLET | Freq: Every day | ORAL | Status: DC
Start: 2012-07-12 — End: 2012-07-16
  Administered 2012-07-13 – 2012-07-16 (×4): 1 via ORAL
  Filled 2012-07-11 (×4): qty 1

## 2012-07-11 MED ORDER — ACETAMINOPHEN 500 MG PO TABS *I*
500.0000 mg | ORAL_TABLET | ORAL | Status: DC | PRN
Start: 2012-07-11 — End: 2012-07-12
  Administered 2012-07-11 – 2012-07-12 (×3): 500 mg via ORAL
  Filled 2012-07-11 (×3): qty 1

## 2012-07-11 MED ORDER — BENZONATATE 100 MG PO CAPS *I*
100.0000 mg | ORAL_CAPSULE | Freq: Three times a day (TID) | ORAL | Status: DC | PRN
Start: 2012-07-11 — End: 2012-07-16
  Filled 2012-07-11 (×8): qty 1

## 2012-07-11 MED ORDER — ALBUTEROL SULFATE (2.5 MG/3ML) 0.083% IN NEBU *I*
2.5000 mg | INHALATION_SOLUTION | RESPIRATORY_TRACT | Status: DC | PRN
Start: 2012-07-11 — End: 2012-07-16

## 2012-07-11 MED ORDER — D5W & 0.45% NACL IV SOLN *I*
100.0000 mL/h | INTRAVENOUS | Status: DC
Start: 2012-07-11 — End: 2012-07-13
  Administered 2012-07-11 – 2012-07-13 (×16): 100 mL/h via INTRAVENOUS

## 2012-07-11 MED ORDER — CALCIUM CITRATE-VITAMIN D 315-200 MG-UNIT PO TABS *A*
1.0000 | ORAL_TABLET | Freq: Two times a day (BID) | ORAL | Status: DC
Start: 2012-07-11 — End: 2012-07-16
  Administered 2012-07-12 – 2012-07-16 (×8): 1 via ORAL
  Filled 2012-07-11 (×13): qty 1

## 2012-07-11 MED ORDER — AMPHETAMINE-DEXTROAMPHETAMINE 20 MG PO CP24 *I*
20.0000 mg | ORAL_CAPSULE | Freq: Every morning | ORAL | Status: DC
Start: 2012-07-12 — End: 2012-07-11

## 2012-07-11 MED ORDER — FLUOXETINE HCL 20 MG PO CAPS *I*
60.0000 mg | ORAL_CAPSULE | Freq: Every day | ORAL | Status: DC
Start: 2012-07-12 — End: 2012-07-16
  Administered 2012-07-13 – 2012-07-16 (×4): 60 mg via ORAL
  Filled 2012-07-11 (×5): qty 3

## 2012-07-11 MED ORDER — MOXIFLOXACIN HCL 400 MG PO TABS *I*
400.0000 mg | ORAL_TABLET | Freq: Every day | ORAL | Status: DC
Start: 2012-07-11 — End: 2012-07-11

## 2012-07-11 MED ORDER — ZIPRASIDONE HCL 20 MG PO CAPS *I*
20.0000 mg | ORAL_CAPSULE | Freq: Two times a day (BID) | ORAL | Status: DC
Start: 2012-07-11 — End: 2012-07-11

## 2012-07-11 MED ORDER — ZIPRASIDONE HCL 20 MG PO CAPS *I*
20.0000 mg | ORAL_CAPSULE | Freq: Every day | ORAL | Status: DC
Start: 2012-07-12 — End: 2012-07-16
  Administered 2012-07-12 – 2012-07-15 (×4): 20 mg via ORAL
  Filled 2012-07-11 (×6): qty 1

## 2012-07-11 NOTE — ED Notes (Signed)
Pt presents from [facility] imaging after CT scan.  C/o hemoptysis x 24 hrs.

## 2012-07-11 NOTE — Progress Notes (Signed)
Jackie Moran is a 61 y.o. female    Comes in with concerns about 24 hours of gross hemoptysis with clots. Yesterday at the ophthalmologist she began coughing and produced bright red blood with some clots. This lasted several minutes to settle down. She went home and had several additional episodes of coughing with blood and went to urgent care at 7 PM. Chest x-ray was clear. She has no fever.    She comes in urgently today. She has persisting cough throughout the morning producing bright red blood and clots. Perhaps up to 1/2 cup. She denies any shortness of breath. Oxygen saturation is lower at 94%.    She quit smoking 29 years ago and had smoked one half pack per day for a few years. Denies any hematuria. No travel history. No productive phlegm.      Patient Active Problem List   Diagnosis Code   . Allergic Rhinitis 477.9   . 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 Gluten intolerance 579.0   . Fatigue 780.79   . Posterior vitreous detachment of left eye 379.21   . Hemoptysis 786.30       ALLERGIES:  Amoxicillin; Dairy digestive; Doxycycline hyclate; Egg yolk; Gluten meal; Penicillins; and No known latex allergy    No current facility-administered medications on file prior to visit.     Current Outpatient Prescriptions on File Prior to Visit   Medication Sig Dispense Refill   . FLUoxetine (PROZAC) 20 MG capsule TAKE 3 CAPSULES DAILY  270 capsule  2   . amphetamine-dextroamphetamine (ADDERALL XR) 10 MG 24 hr capsule May sprinkle contents of capsule on food, do not chew entire capsule. Take one twice a day.  Code B  MDD 2pills  180 capsule  0   . ziprasidone (GEODON) 20 MG capsule TAKE 1 CAPSULE DAILY  90 capsule  3   . Calcium 500 MG tablet 2 times daily       . amphetamine-dextroamphetamine (ADDERALL XR) 20 MG 24 hr capsule Take 1 capsule (20 mg total) by mouth every morning   Swallow capsules or contents whole.  Take 20 mg by mouth every morning  Code B MDD 1  90 capsule  0   .  estrogen conjugated-medroxyPROGESTERone (PREMPRO) 0.3-1.5 MG per tablet Take 1 tablet by mouth daily           . albuterol (PROAIR HFA) 108 (90 BASE) MCG/ACT inhaler INHALE 2 PUFFS EVERY 4-6 HOURS PRN COUGH, WHEEZE, SOB, CHEST TIGHTNESS, SPACED 60 SECONDS APART.  1  0   . DISCONTD: nabumetone (RELAFEN) 500 MG tablet TAKE 1 TABLET TWICE DAILY. using prn  60  3   . Cholecalciferol (VITAMIN D) 1000 UNIT tablet TAKE 1 TABLET DAILY.  1  0   . Multiple Vitamin (MULTIVITAMIN) capsule TAKE 1 CAPSULE DAILY.    0     Medications reviewed, reconciled with patients and changes were made      Exam:  Filed Vitals:    07/11/12 1211   BP: 150/70   Pulse: 98   Temp: 37.3 C (99.2 F)   Height: 1.619 m (5' 3.75")   Weight: 53.343 kg (117 lb 9.6 oz)    Body mass index is 20.35 kg/(m^2).    SpO2 Readings from Last 3 Encounters:   07/11/12 98%   07/11/12 94%   01/25/12 98%       GENERAL APPEARANCE: well appearing, NAD  HEENT: TMs normal, oropharynx clear, no thyroid  enlargement or nodules  neck veins flat  Lymphatic: Anterior and posterior cervical chain, inferior and superior clavicular chain  lymph nodes not enlarged  LUNGS: Coarse rhonchi in left lower lobe  HEART: Normal S1,S2 without murmurs, gallops, or rubs  ABDOMEN: Normal bowel sounds, nondistended, soft, non-tender, without hepato-splenomegaly  EXTREMITIES: No edema  NEUROLOGIC: No focal changes, motor and sensory exam normal.  Gait normal    CT angio:No CT evidence for pulmonary embolism.   Patchy airspace disease which is most pronounced in the left lower   lobe. This likely represents blood within the alveoli. Etiology could   be related to Goodpasture's disease or Wegeners granulomatosis.   Infection is also possible. Other etiologies are not excluded.   Followup is recommended.        1. Hemoptysis  CT ANGIO CHEST, CT ANGIO CHEST, Comprehensive metabolic panel, CBC and differential, APTT, Protime-INR, Comprehensive metabolic panel, CBC and differential, APTT, Protime-INR,  CANCELED: Comprehensive metabolic panel, CANCELED: CBC and differential, CANCELED: APTT, CANCELED: Protime-INR     Acute gross hemoptysis over the past 24 hours.  Amount quite significant and she has some early drop in her Oxygen saturation. CT scan shows patchy airspace disease diffusely most pronounced in the left lower lobe consistent with exam. Dr. Lorenda Peck called me and said she was having uncontrollable coughing after lying supine. I advised transfer to [facility] by ambulance.    Differential diagnosis includes bacterial pneumonia although less likely, malignancy, none seen on CT scan could have endobronchial lesion, autoimmune disease such as Wegener's or Goodpasture's.

## 2012-07-11 NOTE — ED Notes (Signed)
Presents from outpt ct with significant hemoptysis with coughing since 1600 yesterday.

## 2012-07-11 NOTE — ED Notes (Signed)
Received report. Pt resting at this time. Call bell within reach. Will cont to monitor and treat per provider orders

## 2012-07-11 NOTE — Progress Notes (Signed)
Utilization Management    Level of Care Inpatient as of the date 07/11/12      Sharon W Allen     Pager: [phone]

## 2012-07-11 NOTE — ED Provider Notes (Signed)
History     Chief Complaint   Patient presents with   . Hemoptysis     HPI Comments: Pt reports approx 24hrs of painless cough. Cough initially associated with bloodly sputum every or so. Sx abated for a few hours last pm, pt had inc cough and increased bleeding while sleeping last PM. Pt saw pcp today who ordered labs and cta chest. Ct chest showed multiple areas of pulmonary hemmrohage and no PE. Pt currently asymptomatic. Pt reports severe cough when laying flat for ct    The history is provided by the patient.       History reviewed. No pertinent past medical history.         History reviewed. No pertinent past surgical history.    Family History   Problem Relation Age of Onset   . Cataracts Father    . Diabetes Maternal Grandmother    . Amblyopia Other    . Blindness Neg Hx    . Glaucoma Neg Hx    . Retinal detachment Neg Hx          Social History      reports that she has never smoked. She does not have any smokeless tobacco history on file. She reports that she does not currently engage in sexual activity. She reports that she does not drink alcohol or use illicit drugs.    Living Situation    Questions Responses    Patient lives with     Homeless     Caregiver for other family member     External Services     Employment     Domestic Violence Risk           Review of Systems   Review of Systems   Constitutional: Negative.    HENT: Negative.    Eyes: Negative.    Respiratory: Positive for cough.    Cardiovascular: Negative.    Gastrointestinal: Negative.    Endocrine: Negative.    Genitourinary: Negative.    Musculoskeletal: Negative.    Skin: Negative.    Neurological: Negative.    Hematological: Bruises/bleeds easily.   Psychiatric/Behavioral: Negative.        Physical Exam     ED Triage Vitals   BP Heart Rate Heart Rate(via Pulse Ox) Resp Temp Temp src SpO2 O2 Device O2 Flow Rate   07/11/12 1536 07/11/12 1536 -- -- 07/11/12 1536 -- 07/11/12 1536 07/11/12 1536 --   163/100 mmHg 96   36.3 C (97.3  F)  97 % None (Room air)       Weight           07/11/12 1536           53.071 kg (117 lb)               Physical Exam   Constitutional: She is oriented to person, place, and time. She appears well-developed and well-nourished.   Neck: Normal range of motion. Neck supple.   Cardiovascular: Normal rate and regular rhythm.    Pulmonary/Chest:   lll rales   Abdominal: Soft. Bowel sounds are normal.   Musculoskeletal: Normal range of motion.   Neurological: She is alert and oriented to person, place, and time.   Skin: Skin is warm and dry.   Psychiatric: She has a normal mood and affect. Her behavior is normal.       Medical Decision Making   <EDMDM>    Initial Evaluation:  ED First Provider Contact  Date/Time Event User Comments    07/11/12 1542 ED Provider First Contact O'CONNOR, Grisel Blumenstock P (ED) Initial Face to Face Provider Contact          Patient seen by me as above    Assessment:  61 y.o., female comes to the ED with painless hemoptysis. Prelim read on ct chest indicates diffuse pulmonary hemmrohage.     Differential Diagnosis includes malignancy vs autoimmune d/o             Plan: labs, pulm consult admit      Evlyn Kanner, MD    Evlyn Kanner, MD  07/11/12 [phone]

## 2012-07-11 NOTE — H&P (Signed)
MICU Consult - H&P    Consult requested by: Dr Thresa Ross, ED  Reason for consult: hemoptysis    HPI:  Jackie Moran is a 51 F with PMH of possible fibromyalgia, mood disorder, food intolerances, who presents 24 hr after onset of hemoptysis. She was in her usual state of health until 4pm yesterday, started coughing up frank blood up to "half a cup". Seem to resolve after a couple of hours but picked up again overnight and has persisted since then. When to urgent care last night where, she reports, she was sent home after normal CXR. She saw her PCP, Dr Langston Masker, today, who was concerned for PE as she is on Prempro, performed CT angio of chest, sent to [facility] ED when results concerning for diffuse bleeding.    ED providers and nursing estimate a total of 200-300cc blood since arrival.    When coughing, pt has pain in chest and back as well as palpitations. She thinks she has been urinating more frequently recently, though is not aware of any change in quality of urine. Pt denies recent CP (has baseline infrequent chest burning), weight loss, diaphoresis, dyspnea, prior cough, abd pain, change in bowel fxn, nausea, rashes, other bleeding, edema.    Review of Systems: as above, complete ROS otherwise negative    PMH: fibromyalgia, depression, anxiety    Medications:  Albuterol for wheeze  adderall  Fluoxetine  Estrogen/medroxyprogesterone  Geodon  "3 of 3", herbal muscle relaxant with passion flower and valerian root    Allergies:  Allergies   Allergen Reactions   . Amoxicillin    . Dairy Digestive    . Doxycycline Hyclate       Nausea;    . Egg Yolk Hives     Can't eat eggs   . Gluten Meal    . Penicillins    . No Known Latex Allergy      Social History:  Divorced, never smoker.  Denies TB exposure, bird exposure, asbestos, mold. Has a hot tub, says it is kept clean. Grew up in [place], no other significant travel.    Family History:  Family History   Problem Relation Age of Onset   . Cataracts Father    . Diabetes Maternal  Grandmother    . Amblyopia Other    . Blindness Neg Hx    . Glaucoma Neg Hx    . Retinal detachment Neg Hx    . Coronary art dis Father    . Bleeding prob Neg Hx        Physical Exam:  VSS on RA  General: anxious, frail, no obvious discomfort  HEENT: oropharynx and nares clear, no s/o recent bleeding  Cardiac: RRR, no murmurs, no JVP  Lungs: CTAB  Abdomen: soft, nontender, nondistended  Extremities: pulses intact, no edema  Neuro: moving all extremities  Psych: anxious    INR 1.1  PTT 28.6  WBC 15.7  Hct 40  Plt 180  BMP wnl    Radiology:   Ct Angio Chest: diffuse patchy ground glass infiltrates, large majority left base, some R base and LUL. Minimal LAD. No PE. (my read)        ASSESSMENT:  Acute onset hemoptysis with minimal additional symptoms in 62 F on hormone tx, psych hx, food intolerances. While VS are very reassuring, volume of bleeding is substantial. DDx includes infection, vasculitis, AVM, malignancy, pulmonary-renal syndromes, exposure. PE ruled out, no TB risk factors, low suspicion for cardiac etiology, normal coags though some  bleeding disorders still possible. Imaging may suggest proximal source of bleeding with dependent collections, but diffuse process possible.    PLAN:  Pulm: hemoptysis  - admit to MICU service on step-down  - start IV Moxi for CAP and anaerobes  - may eat tonight, NPO after midnight for likely bronchoscopy tomorrow  - await response to above before pursuing vasculitic or auto-immune w/u  - IVF    CVS: no issues    Renal/E/F: check UA for s/o renal involvement such is blood or protein    GI/Nutrition: intolerant of gluten, dairy, eggs    ID: sputum (blood) cx, real blood cxs, UA/reflex, urine Ags    Heme:   - Okay with daily CBC unless bleeding increases. Hemodynamic instability much less likely than hypoxia  - SCDs for DVT ppx     Endo: hold Prempro    Neuro/pain:   - Tylenol prn, may use opiates if HA or chest/back pain uncontrolled, avoid NSAIDs  - cont home adderall, geodon,  fluoxetine      GOALS:  Pulmonary/Ventilator Bundle:  Meet spontaneous breathing trial criteria?  N/A  Decrease FiO2? N/A  Decrease PEEP? N/A  HOB at 30 degrees? N/A  DVT prophylaxis? yes  PUD prophylaxis? N/A  Pain/Sedation holiday indicated today? N/A  Did pain/sedation holiday occur yesterday? N/A  Can activity be advanced?  yes  Can catheters/tubes be d/c'ed? yes    Medication reconciliation:    Should any home meds be restarted? no  Should any meds be d/c'ed? no    Are daily labs needed? yes    Roselle Locus, MD  Fellow, Pulmonary and Critical Care Medicine  07/11/2012 7:29 PM

## 2012-07-11 NOTE — ED Notes (Signed)
Bed:PA-03<BR> Expected date:07/11/12<BR> Expected time: 3:00 PM<BR> Means of arrival: Other [EMS]<BR> Comments:<BR> ADULT CALL-IN    Patient Name:Jackie Moran----    MRN: [id number]----    AGE:    DOB:     PCP/Service Referral: DR. Langston Masker----    Patient Information Note:PT WITH C/O HEMOPTYSIS WITH CLOTS X24H.  SHE WENT FOR A CT OF HER LUNGS AND APPEARS TO BE BLEEDING THROUGHOUT HER LUNGS.  SHE DESATTED IN THE CT SCANNER.  CONCERN FOR DIC AND RAPID DECLINE.-----    Tests/Orders Requested:    Vital Signs:    Releva nt Medications:    Requested Evaluation AV:[id number] ED---    MD Requesting Call Back: NO----  IF CALL BACK REQUESTED:    Notify:   At:    Is caller requesting admission for this patient?: yes/no    If yes, to which service?    Is referring physician an [facility] admitting provider?        Call reported XB:[id number]---    Author Ivory Broad, RN as of 07/11/2012 at 3:00 PM

## 2012-07-12 ENCOUNTER — Ambulatory Visit: Payer: Self-pay | Admitting: Pulmonology

## 2012-07-12 VITALS — BP 112/62 | HR 70 | Temp 97.9°F | Resp 16

## 2012-07-12 LAB — BASIC METABOLIC PANEL
Anion Gap: 10 (ref 7–16)
CO2: 24 mmol/L (ref 20–28)
Calcium: 8.5 mg/dL — ABNORMAL LOW (ref 8.6–10.2)
Chloride: 105 mmol/L (ref 96–108)
Creatinine: 0.73 mg/dL (ref 0.51–0.95)
GFR,Black: 104 *
GFR,Caucasian: 90 *
Glucose: 103 mg/dL — ABNORMAL HIGH (ref 60–99)
Lab: 14 mg/dL (ref 6–20)
Potassium: 3.9 mmol/L (ref 3.3–5.1)
Sodium: 139 mmol/L (ref 133–145)

## 2012-07-12 LAB — CBC
Hematocrit: 33 % — ABNORMAL LOW (ref 34–45)
Hematocrit: 33 % — ABNORMAL LOW (ref 34–45)
Hemoglobin: 11 g/dL — ABNORMAL LOW (ref 11.2–15.7)
Hemoglobin: 10.8 g/dL — ABNORMAL LOW (ref 11.2–15.7)
MCV: 91 fL (ref 79–95)
MCV: 90 fL (ref 79–95)
Platelets: 151 10*3/uL — ABNORMAL LOW (ref 160–370)
Platelets: 152 10*3/uL — ABNORMAL LOW (ref 160–370)
RBC: 3.6 MIL/uL — ABNORMAL LOW (ref 3.9–5.2)
RBC: 3.6 MIL/uL — ABNORMAL LOW (ref 3.9–5.2)
RDW: 13.6 % (ref 11.7–14.4)
RDW: 13.6 % (ref 11.7–14.4)
WBC: 11.3 10*3/uL — ABNORMAL HIGH (ref 4.0–10.0)
WBC: 9 10*3/uL (ref 4.0–10.0)

## 2012-07-12 LAB — URINALYSIS REFLEX TO CULTURE
Blood,UA: NEGATIVE
Leuk Esterase,UA: NEGATIVE
Nitrite,UA: NEGATIVE
Protein,UA: NEGATIVE mg/dL
Specific Gravity,UA: 1.008 (ref 1.002–1.030)
pH,UA: 6 (ref 5.0–8.0)

## 2012-07-12 LAB — DIC PROFILE
D-Dimer: <0.22 ug/mL FEU (ref 0.00–0.50)
Fibrinogen: 371 mg/dL (ref 172–409)
INR: 1.1 (ref 1.0–1.2)
Protime: 10.9 s (ref 9.2–12.3)
aPTT: 28.6 s (ref 25.8–37.9)

## 2012-07-12 LAB — LEGIONELLA ANTIGEN, URINE: Legionella Antigen (Urine): 0

## 2012-07-12 LAB — BODY FLUID CELL COUNT
Nucl Cell,FL: 590 /uL
RBC,FL: 156000 /uL

## 2012-07-12 LAB — POCT GLUCOSE: Glucose POCT: 114 mg/dL — ABNORMAL HIGH (ref 60–99)

## 2012-07-12 LAB — GRAM STAIN

## 2012-07-12 LAB — LACTATE, PLASMA: Lactate: 0.7 mmol/L (ref 0.5–2.2)

## 2012-07-12 LAB — SPEC COAG REVIEW

## 2012-07-12 LAB — S. PNEUMONIAE ANTIGEN: S. pneumoniae Antigen: 0

## 2012-07-12 MED ORDER — ACETAMINOPHEN 325 MG PO TABS *I*
650.0000 mg | ORAL_TABLET | ORAL | Status: DC | PRN
Start: 2012-07-12 — End: 2012-07-16
  Administered 2012-07-12 – 2012-07-15 (×10): 650 mg via ORAL
  Filled 2012-07-12 (×8): qty 2

## 2012-07-12 MED ORDER — ACETAMINOPHEN 500 MG PO TABS *I*
500.0000 mg | ORAL_TABLET | Freq: Once | ORAL | Status: AC
Start: 2012-07-12 — End: 2012-07-12
  Administered 2012-07-12: 500 mg via ORAL
  Filled 2012-07-12: qty 1

## 2012-07-12 NOTE — Procedures (Signed)
Bronchoscopy Procedure Note    DATE OF PROCEDURE: 07/12/2012  TIME OF PROCEDURE:  3 pm  ROOM: AC-3 Bronchoscopy Suite  ROUTE: Oral    FACULTY: Jerre Simon, MD    FELLOW: Harlin Rain, MD    PRIMARY INDICATION: hemoptysis    Current Smoker: no  HIV: no  Immunocompromised, non-HIV: no    Radiographic Findings: infiltrates, focal alveolar  Pre-procedure diagnosis: hemoptysis  Post-procedure diagnosis: same  Other information: see HPI.    PRE-PROCEDURE TIME-OUT:  Please see nursing sedation navigator.    Working IV access was assured before administration of sedatives.  The oropharynx was anesthetized with lidocaine.  The scope was advanced to the epiglottis and vocal cords and these structures were specifically anesthetized before advancing the scope through the cords.  Visual inspection of the lower airways was performed and the below noted samples were obtained.  Additional procedures performed during the bronchoscopy follow.      Airways inspection revealed some blood bilaterally. There was a large clot almost occluding the segment going into the left lower lobe that was mobile with respiration and did not dislodge with washes multiple times. The orifice to the superior segment was visible. After several washes, the clot was instilled with 2 cc of 1:10,000 epinephrine which did not produce a significant change in the appearance. The clot was not dislodged manually due to the possible tamponade effect it may have had. There was also some fresh oozing visible in the inferior segment of the lingula. Bronchial washings were performed of the left lower lobe and inferior segment of the lingula and sent for the following studies. Other airways including the trachea, and airways on the right side appeared to be within normal limits with traces of fresh blood. No endo bronchial lesions were seen.    PROCEDURE:  BRONCHIAL WASH: Location: Left Lower Lobe, Left Upper Lobe  Specimens sent for: Cytology/Cell counts,  Stain/Cx for Bacteria, Norcardia, Stain/Cx for Fungi, Stain/Cx for Mycobacterium, Culture for Actinomycoses and Viral studies    MEDICATIONS FOR PROCEDURE:  60 mg nebulized 4% lidocaine  110 mg topical 1% lidocaine  4 mg Versed  125 mcg Fentanyl  Epinephrine 1:10,000 2 cc via respiratory mucosa    The observed limit of 300 mg of lidocaine was not exceeded.      COMPLICATIONS:  None      Signed: Harlin Rain, MBBS, 07/12/2012

## 2012-07-12 NOTE — ED Notes (Signed)
Pt OOB and ambulated 100 feet to bathroom with one assist. Pt with steady gait.

## 2012-07-12 NOTE — ED Notes (Signed)
Patient alert, NAD. OOB to BR, ambulatory without distress.

## 2012-07-12 NOTE — Patient Instructions (Signed)
Bronchoscopy Discharge Instructions    Instructions:    Do not drive, operative heavy machinery, drink alcoholic beverages, make important or business decisions, or sign legal documents until the next day.    Rest today    Take acetaminophen per package instructions for any mild sore throat or chest discomfort.    Diet:  Begin with liquids, advance to previous diet as tolerated.    Medications:  Please review the medication instructions included in the after visit summary.    Things to Expect:  · A mild sore throat or chest discomfort.  · If you were given medications for sleep or to relax you, you may still feel sleepy.  You may expect to sleep at home for an additional 4 to 6 hours.  · Coughing after the procedure may last 2 to 3 days. You may have traces of blood in your mucus.    You Should Call Your Doctor for any of the Following:  · Persistent fever of 101 degrees F (38.5 degrees C) or higher and/or chills.  · Shortness of breath or trouble catching your breath.  · Sharp pain under the armpit that does not go away.  · Coughing up large amounts of blood (for example, over a teaspoon or increasing amounts).  · Feeling restless.  · Pain or redness at IV site.    If you have a serious problem after hours, call [phone] to reach the physician on call.    If you are unable to reach the provider, go to your nearest Emergency Department.    Follow up care:  Please call [phone] to discuss your procedure/results with the doctor in 5 day(s).      I have read and understand the above instructions and have received a copy.    Patient Signature:______________________________________________    Provider Signature:_____________________________________________    Nurse Signature:_______________________________________________

## 2012-07-12 NOTE — ED Notes (Signed)
Pt ambulating around unit with daughter. Able to make needs known. Will continue to monitor

## 2012-07-12 NOTE — Procedures (Cosign Needed)
Lidocaine Max calculated pre-procedure based on patient's weight of 53 kg. Lidocaine max 4.5mg /kg= 238 mg MAX dose of Lidocaine permitted per hospital policy. Lidocaine will be administered in a 4% nebulized solution prior to the case, and 1% will be given in divided doses topically to mucous membranes of the respiratory tract via the biopsy channel of the bronchoscope. Patient arrived to [facility] in stretcher with transporter. Alert and Oriented x3. Procedure consents reviewed and signed with MD. See scanned consents. Current IV accessed with 500cc NS. See IV assessment/LDA flow-sheet for pertinent IV assessment details. Bilateral breath sounds present, see flow-sheet assessments for further information. Post procedure patient was weaned to room-air, v/s stable. Specimens were reviewed with secondary staff member and delivered for processing. Per protocol pt monitored for 1 hour and reached minimum recovery score for transfer. Report called to [facility] RN on inpatient unit, reviewed pt to be NPO until two hours after procedure and that MDs to be notified for any SOB, fevers greater than 101 degrees Farenheit, sharp pain in side, coughing up more than 1 teaspoon of blood, or feelings of restlessness.  Verbalized understanding, asked questions as appropriate. Pt transferred via ED transport nurse. Pt on 2L NC for ride back to unit. See flow-sheets and MD procedure notes for further information Isaac Bliss, RN 07/12/2012 4:53 PM

## 2012-07-12 NOTE — ED Notes (Signed)
Pt off floor for procedure

## 2012-07-12 NOTE — Progress Notes (Signed)
PULMONARY/CRITICAL CARE ATTENDING:    This patient was evaluated on rounds with the resident physician/fellow/NP.  I personally examined the patient.  I agree with the database, findings, and plan of care recorded in the resident physician note.  Please refer to it for details.     61 year old woman presents with submassive hemoptysis as detailed by Dr. Blenda Nicely.  Admitted to MICU service for observations and bronchoscopy. Did not have any recurrence overnight.  Experienced an additional 15 cc today.    Patient underwent bronchoscopy this afternoon. Large serpentine clot noted to be protruding from left lower lobe. No other frank endobronchial lesions.  We opted not to mechanically dislodge the clot for fear of precipitating additional bleeding.   BP 123/65  Pulse 73  Temp(Src) 36.6 C (97.9 F) (Temporal)  Resp 9  Ht 1.6 m (5' 2.99")  Wt 53.071 kg (117 lb)  BMI 20.73 kg/m2  SpO2 99%   Chest: Clear; no wheeze.   RRR   Abdomen: soft    IMPRESSION/PLAN   Submassive hemoptysis   Serpentine endobronchial clot in left lower lobe orifice; may be masking endobronchial lesion.    Will treat with moxifloxacin  Monitor for recurrence  If massive, call thoracic.  Review emergent procedures with team  Will probably go home in a few days with plan for repeat rigid bronch in a week.         Critical Care Time: 55 minutes  Ardelia Mems MD PhD  Associate Professor of Medicine  Division of Pumonary/Critical Care Medicine  This patient is critically ill with at least 1 organ system failure associated with a high probability of imminent or life threatening deterioration.  The care I have delivered involved high complexity decision making to assess, manipulate, and support vital system function(s), to treat the vital organ system failure and/or prevent further life threatening deterioration of the patient's condition.  All nursing documentation, laboratory data, test results, and radiographs were reviewed and interpreted  by me.  I have established the management plan for this patient's critical illness and have been immediately available to assist with patient care.  My documented total critical care time reflects my own patient care time and does not include teaching or procedure time.

## 2012-07-12 NOTE — Progress Notes (Signed)
MICU Daily Progress Note for Inpatients   LOS: 1 day       Interval History: In trauma bay.     Subjective: Reports feeling weak and shakey.  Right-sided headache partially relieved with tylenol.  No further blood.    Objective Section:  Temp:  [36.3 C (97.3 F)-37.3 C (99.2 F)] 36.8 C (98.2 F)  Heart Rate:  [62-98] 62  Resp:  [13-17] 13  BP: (115-163)/(65-100) 123/65 mmHg     Intake/Output Summary (Last 24 hours) at 07/12/12 1610  Last data filed at 07/11/12 2313   Gross per 24 hour   Intake    250 ml   Output      0 ml   Net    250 ml       Active Hospital Medications:        . calcium citrate-vitamin D  1 tablet Oral BID   . cholecalciferol  1,000 Units Oral Daily   . multi-vitamin  1 tablet Oral Daily   . FLUoxetine  60 mg Oral Daily   . amphetamine-dextroamphetamine  10 mg Oral QAM   . ziprasidone  20 mg Oral Daily           . dextrose 5 % and 0.45% NaCl 100 mL/hr (07/12/12 0414)   . moxifloxacin Stopped (07/11/12 2313)      albuterol, acetaminophen, benzonatate     Physical Exam:  Gen: Standing at side of bed comfortably without cough, NAD  HEENT: Anicteric, MMM  CV: Regular rate rhythm  Chest: Breathing comfortably on room air, CTABL  Abd: ND, NT, soft  Ext: no edema  Neuro: Alert, ambulating well    Lab Results:    Recent Labs  Lab 07/12/12  0124 07/11/12  1632 07/11/12  1334   WBC 11.3* 15.7* 15.3*   Hemoglobin 11.0* 13.4 13.2   Hematocrit 33* 40 40   Platelets 151* 180 190   Seg Neut %  --  89.4* 88.8*   Lymphocyte %  --  4.2* 4.5*   Monocyte %  --  6.2 6.4   Eosinophil %  --  0.1* 0.2*       Recent Labs  Lab 07/11/12  1632 07/11/12  1334   Sodium 140 140   Potassium 4.3 3.7   CO2 24 27   UN 15 14   Creatinine 0.71 0.78   Glucose 101* 171*   Calcium 9.4 9.3       Recent Labs  Lab 07/11/12  1632   Alk Phos 78   Bilirubin,Total 0.6   Albumin 4.9   ALT 21   AST 28   Total Protein 7.0     No results found for this basename: CKTS, TROPU, TROP, RICKMBS, MCKMB, CKMB,  in the last 168 hours    Recent  Labs  Lab 07/11/12  1632 07/11/12  1334   INR 1.1 1.1   aPTT 28.6 31.7   Protime 10.9 11.0     No results found for this basename: ABE, AOSAT, APCO2, APO2, APH,  in the last 168 hours    Micro:  Cultures ordered    Imaging:   Ct Angio Chest    07/11/2012  IMPRESSION:   No CT evidence for pulmonary embolism.   Patchy airspace disease which is most pronounced in the left lower  lobe. This likely represents blood within the alveoli. Etiology could  be related to Goodpasture's disease or Wegeners granulomatosis.  Infection is also possible. Other etiologies are not excluded.  Followup is recommended.   END REPORT       Assessment: Acute onset hemoptysis with minimal additional symptoms in 82 F on hormone tx, psych hx, food intolerances. While VS are very reassuring, volume of bleeding is substantial. DDx includes infection, vasculitis, AVM, malignancy, pulmonary-renal syndromes, exposure. PE ruled out, no TB risk factors, low suspicion for cardiac etiology, normal coags though some bleeding disorders still possible. Imaging may suggest proximal source of bleeding with dependent collections, but diffuse process possible.    PLAN:    Pulm:  - Day 2 IV Moxi for CAP and anaerobes   - NPO for bronchoscopy today  - await response to above before pursuing vasculitic or auto-immune w/u   - IVF Dextrose 5%-0.45% NS 100cc/hr  - albuterol and tessalon PRN    CVS:   - hemodynamically stable overnight    Renal/E/F: Cr 0.71  Volume goal for next 24hrs : Even    GI/Nutrition:   - gluten, egg, dairy free diet after procedure (allergic to casein)    ID (Include start and stop dates for abx):   - moxifloxacin started 07/11/12    Heme: Hct 40 -> 33   Transfusion? Is Hct <21? no  - send type & screen  - check if consented for transfusion    Endo: BG 171  - estrogen/medroxyprogesterone not on formulary    Neuro/pain:   - continue Adderall, ziprasidone, fluoxetine  - avoid NSAIDs due to bleeding  - Tylenol PRN    GOALS:  Pulmonary/Ventilator  Bundle:  Meet spontaneous breathing trial criteria?  N/A  Decrease FiO2? N/A  Decrease PEEP? N/A  HOB at 30 degrees? N/A  DVT prophylaxis? yes  PUD prophylaxis? no  Pain/Sedation holiday indicated today? N/A  Did pain/sedation holiday occur yesterday? N/A  Can activity be advanced?  no  Can catheters/tubes be d/c'ed? no    Medication reconciliation:    Should any home meds be restarted? no  Should any meds be d/c'ed? no    Are daily labs needed? yes    Author: Randall An, MD  as of: 07/12/2012 at: 6:23 AM

## 2012-07-12 NOTE — ED Notes (Signed)
Coughed up approximately 15 ml of bright red blood and clots after walked to bathroom. No pain.

## 2012-07-12 NOTE — ED Notes (Signed)
Assumed care of patient, sleeping at this time. Will continue to monitor

## 2012-07-13 LAB — TYPE AND SCREEN
ABO RH Blood Type: A POS
Antibody Screen: NEGATIVE

## 2012-07-13 LAB — CBC
Hematocrit: 31 % — ABNORMAL LOW (ref 34–45)
Hemoglobin: 10.3 g/dL — ABNORMAL LOW (ref 11.2–15.7)
MCV: 91 fL (ref 79–95)
Platelets: 143 10*3/uL — ABNORMAL LOW (ref 160–370)
RBC: 3.4 MIL/uL — ABNORMAL LOW (ref 3.9–5.2)
RDW: 13.4 % (ref 11.7–14.4)
WBC: 8.6 10*3/uL (ref 4.0–10.0)

## 2012-07-13 LAB — BASIC METABOLIC PANEL
Anion Gap: 9 (ref 7–16)
CO2: 23 mmol/L (ref 20–28)
Calcium: 8.3 mg/dL — ABNORMAL LOW (ref 8.6–10.2)
Chloride: 107 mmol/L (ref 96–108)
Creatinine: 0.71 mg/dL (ref 0.51–0.95)
GFR,Black: 108 *
GFR,Caucasian: 94 *
Glucose: 144 mg/dL — ABNORMAL HIGH (ref 60–99)
Lab: 7 mg/dL (ref 6–20)
Potassium: 3.9 mmol/L (ref 3.3–5.1)
Sodium: 139 mmol/L (ref 133–145)

## 2012-07-13 LAB — FUNGAL STAIN: Fungal Stain: 0

## 2012-07-13 LAB — INFLUENZA  A & B/RSV PCR: Influenza A&B/RSV PCR: 0

## 2012-07-13 LAB — AFB STAIN: AFB Stain: 0

## 2012-07-13 MED ORDER — MOXIFLOXACIN HCL 400 MG PO TABS *I*
400.0000 mg | ORAL_TABLET | Freq: Every day | ORAL | Status: DC
Start: 2012-07-13 — End: 2012-07-16
  Administered 2012-07-13 – 2012-07-16 (×4): 400 mg via ORAL
  Filled 2012-07-13 (×5): qty 1

## 2012-07-13 NOTE — Progress Notes (Signed)
834 PCU nursing note:     Pt vital signs ar stable, afebrile at this time. No hemoptysis overnight, no evidence of hemoptysis during assessment. Pt was A & O x3, with no c/o weakness or other deficits from blood loss. Pt pending transfer to floor when a bed becomes available. 1430 report called to [phone] waiting on bed to be cleaned. Pt transferred to floor in wheelchair with all belongings with patient.   Patient currently in bed, side rails up x3, call bell with in reach. Please see doc flow sheets, MAR for complete information, vital signs and assessments. Ranelle Oyster, RN

## 2012-07-13 NOTE — Plan of Care (Signed)
Mobility    . Patient's functional status is maintained or improved Maintaining    Pt is UAL with bathroom priviledges    Nutrition    . Patient's nutritional status is maintained or improved Maintaining    Tolerating diet     Pain/Comfort    . Patient's pain or discomfort is manageable Maintaining    PRN medication available    Safety    . Patient will remain free of falls Maintaining    . Prevent any intentional injury Maintaining    Bed locked in lowest position with call light in reach

## 2012-07-13 NOTE — Progress Notes (Addendum)
General Daily SOAP Progress Note for Inpatients - Transfer from MICU Accept Note   LOS: 2 days       Subjective    This is a 61 y/o female who developed hemoptysis on Wed morning 11/13 which progressed in frequency and volume leading to hospitalization on Friday 11/15.  She has never had any pulmonary issues or bleeding disorder.  No change in her medications recently.  She went for an urgent bronchoscopy revealing endobronchial clot LLL.  Unable to see underlying region and it was felt best to not disturb and treat with observation and antibiotics.  She has been on Avelox.  She has been in the MICU and no further episodes.  Her Hct has dropped from 40 -> 31.  She denies any dyspnea but finds it difficult to take a deep breath.  No fever or chills.    Vitals: Blood pressure 118/61, pulse 79, temperature 37.1 C (98.8 F), temperature source Temporal, resp. rate 16, height 1.6 m (5' 2.99"), weight 53.071 kg (117 lb), SpO2 95.00%.   Vital-Signs Ranges: Temp:  [36.4 C (97.5 F)-37.1 C (98.8 F)] 37.1 C (98.8 F)  Heart Rate:  [65-79] 79  Resp:  [13-18] 16  BP: (105-138)/(54-74) 118/61 mmHg    O2 Device: None (Room air) (07/13/12 1555)      Intake/Output last 3 shifts:  I/O last 3 completed shifts:  11/15 1500 - 11/16 1459  In: 3606.7 (68 mL/kg) [I.V.:3606.7 (2.8 mL/kg/hr)]  Out: - (0 mL/kg)   Net: 3606.7  Weight used: 53.1 kg  Intake/Output this shift:  I/O this shift:  11/16 1500 - 11/16 2259  In: 500 (9.4 mL/kg) [P.O.:500]  Out: - (0 mL/kg)   Net: 500  Weight used: 53.1 kg    Nursing pain score: Last Nursing documented pain:  0-10 Scale: 0 (07/13/12 1551)      Objective    HEENT - NC/AT, sclera not icteric  Pharynx without erythema or exudate  Neck - supple no JVD  Lungs - slightly decreased BS in the L base without wheezes, rales or rhonchi  Heart - RR S1S2 no murmur  ABD - BS present, soft, NT  Ext - no edema  Neuro - alert and mentating      Lab Results:  All labs in the last 24 hours   Recent Results (from the  past 24 hour(s))   POCT GLUCOSE    Collection Time    07/12/12  9:28 PM       Result Value Range    Glucose POCT 114 (*) 60 - 99 mg/dL   TYPE AND SCREEN    Collection Time    07/13/12  3:36 AM       Result Value Range    ABO RH Blood Type A RH POS      Antibody Screen Negative     CBC    Collection Time    07/13/12  3:36 AM       Result Value Range    WBC 8.6  4.0 - 10.0 THOU/uL    RBC 3.4 (*) 3.9 - 5.2 MIL/uL    Hemoglobin 10.3 (*) 11.2 - 15.7 g/dL    Hematocrit 31 (*) 34 - 45 %    MCV 91  79 - 95 fL    RDW 13.4  11.7 - 14.4 %    Platelets 143 (*) 160 - 370 THOU/uL   BASIC METABOLIC PANEL    Collection Time    07/13/12  3:36 AM  Result Value Range    Glucose 144 (*) 60 - 99 mg/dL    Sodium 454  098 - 119 mmol/L    Potassium 3.9  3.3 - 5.1 mmol/L    Chloride 107  96 - 108 mmol/L    CO2 23  20 - 28 mmol/L    Anion Gap 9  7 - 16    UN 7  6 - 20 mg/dL    Creatinine 1.47  8.29 - 0.95 mg/dL    GFR,Caucasian 94      GFR,Black 108      Calcium 8.3 (*) 8.6 - 10.2 mg/dL     Micro:  Bronchial Wash:  Gram stain GPC in pairs  Negative for influenza  Negative Fungal stain  Negative AFB smear  Aerobic, Fungal, AFB, Viral and Legionella cultures are pending      Radiology impressions (last 3 days):  Ct Angio Chest    07/11/2012  IMPRESSION:   No CT evidence for pulmonary embolism.   Patchy airspace disease which is most pronounced in the left lower  lobe. This likely represents blood within the alveoli. Etiology could  be related to Goodpasture's disease or Wegeners granulomatosis.  Infection is also possible. Other etiologies are not excluded.  Followup is recommended.   END REPORT       Currently Active/Followed Hospital Problems:  Active Hospital Problems    Diagnosis   . Hemoptysis       Assessment and Plan Section:  Assessment & Plan    This is a 61 y/o female who presented to the ED with Hemoptysis    1) Hemoptysis - unclear etiology  Possible Pneumonia vs blood in alveoli secondary to hemorrhage  S/P Bronchoscopy with  endobronchial clot noted  Currently stable after being observed in the ICU  Empirically on Moxifloxacin  Will need further evaluation once culture data is back  Will need repeat Bronchoscopy with Pulmonary vs Thoracic Surgery  Continue to follow Hct  Holding hormonal therapy (Prempro)    2) Mood Disorder / Depression / Anxiety  Continue all usual psych meds    3) Dispo - likely home soon if remains stable.    Author: Adele Schilder, PA  as of: 07/13/2012  at: 6:26 PM

## 2012-07-13 NOTE — Progress Notes (Signed)
Patient arrived via wheelchair and ambulated to bed.  Assessment as documented.  VSS.  No new orders at this time.  Patient oriented to bed mechanics and call bell.  Will continue to monitor.  Granville Lewis, RN

## 2012-07-13 NOTE — Interim Hospital Course Summary (Signed)
Interim Summary for long stay patient    Hospital Problem List:  ACTIVE    Diagnosis Date Noted   . Hemoptysis [786.30] 07/11/2012      RESOLVED    Diagnosis Date Noted Date Resolved   No resolved problems to display.       HospCourse  61 year old caucasian female with PMH of depression, food intolerance, and on hormonal therapy presenting with submassive hemoptysis.    Hemoptysis:  CT angio completed which ruled out pulmonary embolism.  [place] endobronchial clot in left lower lobe orifice seen on bronchoscopy.  Clot was left in place so as not to promote further bleeding.  Patient was started on moxifloxacin (started 07/11/12)  to cover CAP and anaerobes.  She will need outpatient follow up for repeat imaging and/or bronchoscopy.      Do NOT erase these  green markers that allow the Discharge Summary to pull in this Hospital Course data.    First Signed: Randall An, MD  On: 07/13/2012  at: 5:06 PM

## 2012-07-13 NOTE — Progress Notes (Addendum)
MICU Daily Progress Note for Inpatients   LOS: 2 days       Interval History: No acute events.     Subjective: The patient reports feeling ok this morning. She reports a continued nonproductive cough with no further hemoptysis, no CP, no SOB, no dysuria. She reports frequent urination and no n/v/d though she noted that a BM yesterday was formed but dark in color.     Objective Section:  Temp:  [36.4 C (97.5 F)-36.7 C (98.1 F)] 36.4 C (97.5 F)  Heart Rate:  [65-82] 65  Resp:  [9-18] 18  BP: (90-149)/(53-79) 114/64 mmHg     Intake/Output Summary (Last 24 hours) at 07/13/12 0747  Last data filed at 07/13/12 0659   Gross per 24 hour   Intake 4591.67 ml   Output      0 ml   Net 4591.67 ml       Active Hospital Medications:        . moxifloxacin  400 mg Intravenous Q24H   . calcium citrate-vitamin D  1 tablet Oral BID   . cholecalciferol  1,000 Units Oral Daily   . multi-vitamin  1 tablet Oral Daily   . FLUoxetine  60 mg Oral Daily   . amphetamine-dextroamphetamine  10 mg Oral QAM   . ziprasidone  20 mg Oral Daily           . dextrose 5 % and 0.45% NaCl 100 mL/hr (07/13/12 0708)      acetaminophen, albuterol, benzonatate     Physical Exam:  Gen: Lying down in bed, NAD  HEENT: Anicteric, MMM  CV: Regular rate rhythm  Chest: Breathing comfortably on room air, mild diffuse expiratory wheezing on right  Abd: ND, NT, soft  Ext: no edema  Neuro: Alert, ambulating well    Lab Results:    Recent Labs  Lab 07/13/12  0336 07/12/12  0722 07/12/12  0124 07/11/12  1632 07/11/12  1334   WBC 8.6 9.0 11.3* 15.7* 15.3*   Hemoglobin 10.3* 10.8* 11.0* 13.4 13.2   Hematocrit 31* 33* 33* 40 40   Platelets 143* 152* 151* 180 190   Seg Neut %  --   --   --  89.4* 88.8*   Lymphocyte %  --   --   --  4.2* 4.5*   Monocyte %  --   --   --  6.2 6.4   Eosinophil %  --   --   --  0.1* 0.2*       Recent Labs  Lab 07/13/12  0336 07/12/12  0722 07/11/12  1632   Sodium 139 139 140   Potassium 3.9 3.9 4.3   CO2 23 24 24    UN 7 14 15    Creatinine 0.71  0.73 0.71   Glucose 144* 103* 101*   Calcium 8.3* 8.5* 9.4       Recent Labs  Lab 07/11/12  1632   Alk Phos 78   Bilirubin,Total 0.6   Albumin 4.9   ALT 21   AST 28   Total Protein 7.0     No results found for this basename: CKTS, TROPU, TROP, RICKMBS, MCKMB, CKMB,  in the last 168 hours    Recent Labs  Lab 07/11/12  1632 07/11/12  1334   INR 1.1 1.1   aPTT 28.6 31.7   Protime 10.9 11.0     No results found for this basename: ABE, AOSAT, APCO2, APO2, APH,  in the last 168 hours  Micro:  Cultures ordered    Imaging:   Ct Angio Chest    07/11/2012  IMPRESSION:   No CT evidence for pulmonary embolism.   Patchy airspace disease which is most pronounced in the left lower  lobe. This likely represents blood within the alveoli. Etiology could  be related to Goodpasture's disease or Wegeners granulomatosis.  Infection is also possible. Other etiologies are not excluded.  Followup is recommended.   END REPORT       Assessment: Acute onset hemoptysis with minimal additional symptoms in 35 F on hormone tx, psych hx, food intolerances. While VS are very reassuring, volume of bleeding is substantial. DDx includes infection, vasculitis, AVM, malignancy, pulmonary-renal syndromes, exposure. PE ruled out, no TB risk factors, low suspicion for cardiac etiology, normal coags though some bleeding disorders still possible. Imaging may suggest proximal source of bleeding with dependent collections, but diffuse process possible.    PLAN:    Pulm:  - Day 3 IV Moxi for CAP and anaerobes   - saturating well on room air  - bronchoscopy yesterday showing large occlusive blood clot in left lower lobe. Bronchial wash gram stain with few gram positive cocci in pairs; Viral, fungal, and AFB cultures pending.   - urine legionella and strep pneumo antigens negative   - awaiting BAL cultures before pursuing vasculitic or auto-immune w/u   - albuterol and tessalon PRN    CVS:   - hemodynamically stable overnight  - Hct 31 <-- 33    Renal/E/F: Cr  0.71;   - net positive 5.6 L yesterday  Volume goal for next 24hrs : Even or partial negative    GI/Nutrition:   - gluten, egg, dairy free diet after procedure (allergic to casein)  - d/c IVF as patient is able to tolerate PO    ID (Include start and stop dates for abx): afebrile, normal HR, not tachypneic, WBC trending down, now 0/4 SIRS  - moxifloxacin started 07/11/12, day 3    Heme: Hct 33 -> 31   Transfusion? Is Hct <21? no  - check if consented for transfusion    Endo: no acute issues  - estrogen/medroxyprogesterone not on formulary    Neuro/pain:   - continue Adderall, ziprasidone, fluoxetine  - avoid NSAIDs due to bleeding  - Tylenol PRN    GOALS:  Pulmonary/Ventilator Bundle:  Meet spontaneous breathing trial criteria?  N/A  Decrease FiO2? N/A  Decrease PEEP? N/A  HOB at 30 degrees? N/A  DVT prophylaxis? yes  PUD prophylaxis? no  Pain/Sedation holiday indicated today? N/A  Did pain/sedation holiday occur yesterday? N/A  Can activity be advanced?  no  Can catheters/tubes be d/c'ed? no    Medication reconciliation:    Should any home meds be restarted? no  Should any meds be d/c'ed? no    Are daily labs needed? yes    Author: Mitchell Heir, MD  as of: 07/13/2012 at: 7:47 AM    MICU Attending    I evaluated this patient with the resident physicians.  I personally examined the patient yesterday evening on rounds as well as this morning, and reviewed all pertinent radiographs. I agree with the database, findings, and plan of care recorded in the resident physician note. Please refer to it for details.    In summary, she has had no further hemoptysis and is stable for call out to floor.  If hemoptysis does not recur can be discharged on ATBx in next few days.  Will need f/u with  Pulmonary for repeat imaging and/or bronchoscopy.    Oretha Caprice, MD  07/13/2012, 3:46 PM

## 2012-07-13 NOTE — Progress Notes (Signed)
834 nursing note: Vss. Resp stable on RA. No hemoptysis noted overnight. No cough noted.  Tol sips of po well. Slept most of shift. Prn tylenol given for h/a with good relief. OOB to BR with assist. Voiding but no stool per pt. Please see flowsheets for full assessments.Bubba Hales, RN

## 2012-07-14 LAB — BASIC METABOLIC PANEL
Anion Gap: 9 (ref 7–16)
CO2: 25 mmol/L (ref 20–28)
Calcium: 9.1 mg/dL (ref 8.6–10.2)
Chloride: 106 mmol/L (ref 96–108)
Creatinine: 0.89 mg/dL (ref 0.51–0.95)
GFR,Black: 82 *
GFR,Caucasian: 71 *
Glucose: 97 mg/dL (ref 60–99)
Lab: 10 mg/dL (ref 6–20)
Potassium: 4.1 mmol/L (ref 3.3–5.1)
Sodium: 140 mmol/L (ref 133–145)

## 2012-07-14 LAB — AEROBIC CULTURE: Aerobic Culture: 0

## 2012-07-14 LAB — CBC
Hematocrit: 33 % — ABNORMAL LOW (ref 34–45)
Hemoglobin: 10.6 g/dL — ABNORMAL LOW (ref 11.2–15.7)
MCV: 92 fL (ref 79–95)
Platelets: 162 10*3/uL (ref 160–370)
RBC: 3.6 MIL/uL — ABNORMAL LOW (ref 3.9–5.2)
RDW: 13.4 % (ref 11.7–14.4)
WBC: 8.2 10*3/uL (ref 4.0–10.0)

## 2012-07-14 MED ORDER — AMPHETAMINE-DEXTROAMPHETAMINE 10 MG PO CP24 *I*
10.0000 mg | ORAL_CAPSULE | Freq: Every day | ORAL | Status: DC
Start: 2012-07-15 — End: 2012-07-16
  Administered 2012-07-15: 10 mg via ORAL
  Filled 2012-07-14: qty 1

## 2012-07-14 MED ORDER — AMPHETAMINE-DEXTROAMPHETAMINE 10 MG PO CP24 *I*
10.0000 mg | ORAL_CAPSULE | Freq: Every day | ORAL | Status: DC
Start: 2012-07-15 — End: 2012-07-16
  Administered 2012-07-15 – 2012-07-16 (×2): 10 mg via ORAL
  Filled 2012-07-14 (×2): qty 1

## 2012-07-14 MED ORDER — AMPHETAMINE-DEXTROAMPHETAMINE 10 MG PO CP24 *I*
10.0000 mg | ORAL_CAPSULE | Freq: Every day | ORAL | Status: DC
Start: 2012-07-14 — End: 2012-07-14

## 2012-07-14 NOTE — Progress Notes (Signed)
Hospital Medicine Progress Note                                                Significant 24 Hour Events      No acute events.    Subjective      Pt overall feels weak, denies CP, lightheadedness, dizziness, further hemoptysis. No fevers but had some sweats, not drenching.    Objective      Physical Exam  Recent vital signs reviewed and notable for:    Filed Vitals:    07/14/12 0856   BP: 104/55   Pulse: 81   Temp: 36.8 C (98.2 F)   Resp: 16   Height:    Weight:      Gen: Lying down in bed, NAD   CV: Regular rate rhythm   Chest: Breathing comfortably on room air,   Abd: ND, NT, soft  Ext: no edema  Neuro: Alert,     Recent Lab, Micro, and Imaging Studies   Personally reviewed and notable for:    140 106 10 97  4.1 25 0.89    Wbc 8.2, hct 33 stable, plt 162    Gram +cocci in pairs brom bronch wash, NGTD on culture data    No new imaging    Assessment     61 yo F h/o depression, presented with submassive hemoptysis of unclear etiology and resultant anemia, no further episodes and stable Hct.     Plan     []  Hematemesis  -- unclear etiology, no further episodes  -- will need to f/u with Pulmonology for further imaging and possibly Thoracic surg for repeat bronchoscopy, may need referral from PCP  -- cont Moxi day 4/7 days to cover for CAP    []  Anemia  -- stable  -- start daily iron supplementation x 1 mo, to be further followed by PCP    Medically stable for discharge, pt will attempt to ambulate and make necessary arrangements. If sx with ambulation, ok to stay overnight, otherwise plan on d/c home this evening.        Ryosuke Ericksen, MD on 07/14/2012 at 11:35 AM  Pager [phone]

## 2012-07-14 NOTE — Progress Notes (Signed)
Patient slept all shift and offered no c/o.

## 2012-07-14 NOTE — Progress Notes (Signed)
Pt seen and examined.  See attending note for detailed exam and plan. Chart reviewed.  Pt reports increased cough and fatigue with ambulation today.    Hemoptysis:  --stable  --s/p bronchoscopy with presence of endobrachial clot  --currently on Moxifloxacin day 4/7 for possible CAP  --need outpt f/u with pulm and possible thoracic surgery for repeat bronch    Anemia:  -- stable  -- iron supplement for 1 month, f/u with PCP    Dispo:  --home tomorrow    Claria Dice, PA on 07/14/2012

## 2012-07-15 DIAGNOSIS — D62 Acute posthemorrhagic anemia: Secondary | ICD-10-CM | POA: Diagnosis present

## 2012-07-15 LAB — BASIC METABOLIC PANEL
Anion Gap: 11 (ref 7–16)
CO2: 23 mmol/L (ref 20–28)
Calcium: 8.6 mg/dL (ref 8.6–10.2)
Chloride: 105 mmol/L (ref 96–108)
Creatinine: 0.85 mg/dL (ref 0.51–0.95)
GFR,Black: 87 *
GFR,Caucasian: 75 *
Glucose: 95 mg/dL (ref 60–99)
Lab: 15 mg/dL (ref 6–20)
Potassium: 3.8 mmol/L (ref 3.3–5.1)
Sodium: 139 mmol/L (ref 133–145)

## 2012-07-15 LAB — CBC
Hematocrit: 34 % (ref 34–45)
Hemoglobin: 11.4 g/dL (ref 11.2–15.7)
MCV: 90 fL (ref 79–95)
Platelets: 187 10*3/uL (ref 160–370)
RBC: 3.8 MIL/uL — ABNORMAL LOW (ref 3.9–5.2)
RDW: 13.3 % (ref 11.7–14.4)
WBC: 6.3 10*3/uL (ref 4.0–10.0)

## 2012-07-15 MED ORDER — LACTOBACILLUS RHAMNOSUS (GG) PO CAPS *A*
1.0000 | ORAL_CAPSULE | Freq: Every day | ORAL | Status: DC
Start: 2012-07-15 — End: 2012-07-16
  Administered 2012-07-15 – 2012-07-16 (×2): 1 via ORAL
  Filled 2012-07-15 (×2): qty 1

## 2012-07-15 MED ORDER — POLYETHYLENE GLYCOL 3350 PO PACK 17 GM *I*
17.0000 g | PACK | Freq: Every day | ORAL | Status: DC
Start: 2012-07-15 — End: 2012-07-16
  Administered 2012-07-15 – 2012-07-16 (×2): 17 g via ORAL
  Filled 2012-07-15 (×2): qty 17

## 2012-07-15 NOTE — Progress Notes (Signed)
Pt seen and examined.  Chart reviewed.  Discussed with Dr. Donnetta Simpers.  See his note for detailed exam and plans.   Pt reports one episode of hemoptysis this morning coughing up a dime sized amount of blood.  She continues to feel lousy and dizzy at times, especially while walking.    Hemoptysis s/p bronchoscopy with presence of endobrachial clot :   --one reoccurrence this morning; will cont to monitor for further episodes and consider repeat bronchoscopy with thoracic surgery if continues or worsens  --currently on Moxifloxacin day 5/7 for possible CAP   --need outpt f/u with pulm and possible thoracic surgery for repeat bronch     Anemia:   -- stable   -- iron supplement for 1 month, f/u with PCP     Dispo:   --home soon if no further episodes of hemoptysis and symptoms stabilized    Claria Dice, PA on 07/15/2012

## 2012-07-15 NOTE — Progress Notes (Signed)
Pt coughed up 1 small, dark red clot. Will continue to monitor.    Deniece Portela, RN

## 2012-07-15 NOTE — Progress Notes (Addendum)
Patient seen & chart reviewed. Mild episode of hemoptysis, feels dizzy this AM  Interval history:Symptoms: Review of Systems :  Constitutional: Negative for fever and chills.   Respiratory: Negative for cough and shortness of breath.   Cardiovascular: Negative for chest pain, palpitations and orthopnea.   Gastrointestinal: Negative for nausea, vomiting and abdominal pain.     Diet: Adequate intake.   Activity level: Returning to normal.   No  BM in past 24 hours  Pain: none      Filed Vitals:    07/15/12 0154 07/15/12 0530 07/15/12 0747 07/15/12 0803   BP: 106/59  97/57 115/66   Pulse: 67  70    Temp: 36.9 C (98.4 F)  36.6 C (97.9 F)    TempSrc: Temporal  Temporal    Resp: 18 18 18     Height:       Weight:       SpO2: 95%  95%        Physical Exam:  General:AAO x3  Lungs: CTAB  Heart: RRR,no MRG  Abd: S/NT/ND  Ext: no e/c/c      Labs: reviewed      Recent Labs  Lab 07/15/12  0158 07/14/12  0033 07/13/12  0336   WBC 6.3 8.2 8.6   Hemoglobin 11.4 10.6* 10.3*   Hematocrit 34 33* 31*   Platelets 187 162 143*         Lab results: 07/15/12  0158 07/14/12  0033 07/13/12  0336   Sodium 139 140 139   Potassium 3.8 4.1 3.9   Chloride 105 106 107   CO2 23 25 23    UN 15 10 7    Creatinine 0.85 0.89 0.71   GFR,Caucasian 75 71 94   GFR,Black 87 82 108   Glucose 95 97 144*   Calcium 8.6 9.1 8.3*         Recent Labs  Lab 07/11/12  1632 07/11/12  1334   ALT 21 17   AST 28 21       Recent Labs  Lab 07/11/12  1632 07/11/12  1334   INR 1.1 1.1         Recent Labs  Lab 07/12/12  2128 07/11/12  1812   Glucose POCT 114* 114*         A/P:60 yo F h/o depression, presented with  hemoptysis of unclear etiology and resultant anemia, no further episodes and stable Hct.   Plan      Hemoptysis:  -- unclear etiology, one mild episode this AM,will reconsult pulmonary  -- will need to f/u with Pulmonology for further imaging and possibly Thoracic surg for repeat bronchoscopy as O/P, may need referral from PCP   -- cont Moxi day 4/7 days to cover  for CAP     Anemia ,blood loss d/t hemoptysis  -- stable    to be further followed by PCP .   Constipation: bowel regimen  Dispo:if Medically stable ,D/C in AM.   D/W covering APP  Prescriptions prior to admission   Medication Sig   . FLUoxetine (PROZAC) 20 MG capsule TAKE 3 CAPSULES DAILY   . amphetamine-dextroamphetamine (ADDERALL XR) 10 MG 24 hr capsule May sprinkle contents of capsule on food, do not chew entire capsule. Take one twice a day.  Code B  MDD 2pills   . ziprasidone (GEODON) 20 MG capsule TAKE 1 CAPSULE DAILY   . Calcium 500 MG tablet 2 times daily   . amphetamine-dextroamphetamine (ADDERALL XR) 20 MG  24 hr capsule Take 1 capsule (20 mg total) by mouth every morning   Swallow capsules or contents whole.  Take 20 mg by mouth every morning  Code B MDD 1   . estrogen conjugated-medroxyPROGESTERone (PREMPRO) 0.3-1.5 MG per tablet Take 1 tablet by mouth daily       . albuterol (PROAIR HFA) 108 (90 BASE) MCG/ACT inhaler INHALE 2 PUFFS EVERY 4-6 HOURS PRN COUGH, WHEEZE, SOB, CHEST TIGHTNESS, SPACED 60 SECONDS APART.   Marland Kitchen Cholecalciferol (VITAMIN D) 1000 UNIT tablet TAKE 1 TABLET DAILY.   . Multiple Vitamin (MULTIVITAMIN) capsule TAKE 1 CAPSULE DAILY.     No prescriptions prior to admission     Scheduled Meds:     . amphetamine-dextroamphetamine  10 mg Oral Daily with breakfast   . amphetamine-dextroamphetamine  10 mg Oral Daily   . moxifloxacin  400 mg Oral Daily   . calcium citrate-vitamin D  1 tablet Oral BID   . cholecalciferol  1,000 Units Oral Daily   . multi-vitamin  1 tablet Oral Daily   . FLUoxetine  60 mg Oral Daily   . ziprasidone  20 mg Oral Daily

## 2012-07-15 NOTE — Progress Notes (Addendum)
Pulmonary Progress Note     LOS: 4 days     Events: single episode hemoptysis, dark, size of watermelon seed  Subjective: still c/o low energy, but no baseline cough, fever, chills, pain, anorexia, palpitations.    VSS  General: sitting in bed, NAD  HEENT: PER, MMM  Resp: lungs are clear with good air movement bilaterally  Neuro: A+O  Psych: affect appropriate    Hct stable, leukocytosis resolved, Cr stable    Current Meds:  Scheduled Meds:     . polyethylene glycol  17 g Oral Daily   . lactobacillus rhamnosus (GG)  1 capsule Oral Daily   . amphetamine-dextroamphetamine  10 mg Oral Daily with breakfast   . amphetamine-dextroamphetamine  10 mg Oral Daily   . moxifloxacin  400 mg Oral Daily   . calcium citrate-vitamin D  1 tablet Oral BID   . cholecalciferol  1,000 Units Oral Daily   . multi-vitamin  1 tablet Oral Daily   . FLUoxetine  60 mg Oral Daily   . ziprasidone  20 mg Oral Daily     Continuous Infusions:   PRN Meds:.     . acetaminophen  650 mg Oral Q4H PRN   . albuterol  2.5 mg Nebulization Q4H PRN   . benzonatate  100 mg Oral TID PRN         Assessment:  Hemoptysis most likely from CAP in 55 F generally in good health. Bronchoscopy results show wash with gram positive organisms, cytology shows intracellular bacteria, both raising likelihood of active infection as underlying cause. Small bleeding today likely related to prior bleeding or perhaps trace fresh bleeding if described LLL clot was dislodged. Either way, underlying process seems to be improving and there is no evidence of clinically significant ongoing bleeding.    - appropriate for d/c home  - complete full course of abx for CAP  - should f/u with me in pulmonary clinic, may call [phone] to schedule. Will likely receive f/u CT and/or bronchoscopy help rule additional contributing factor to this episode of hemoptysis    Thank you for the consult. Please call with any further questions or concerns.    Roselle Locus, MD  Fellow, Pulmonary and Critical  Care Medicine  07/15/2012 6:15 PM     I interviewed and examined Ms. Vicente with Dr. Blenda Nicely and agree with his impression and plans.  Her bronch is reassuring that a significant endobronchial lesion is very very low.  Intracellular organisms found on BAL and CT changes suggest PNA along with parenchymal blood.  Would complete course of ATB for CAP.  Will need follow up in pulmonary to review follow up imaging studies and decide if any follow up bronch needs to be considered.  My suspicion is this is simply a PNA and not related to a pulmonary hemorrhage syndrome (DAH or ANCA+ vasculitis) or a neoplastic process.  Will be important she has careful review of her follow up CXR---likely will need CT too.  Imaging studies reviewed and pt discussion took place during this 35 min evaluation  PLEVY [id number]

## 2012-07-15 NOTE — Plan of Care (Signed)
Problem: Pain/Comfort  Goal: Patient's pain or discomfort is manageable  Outcome: Maintaining  Pt received tylenol for a headache around 2100. Positive effects per patient.

## 2012-07-16 ENCOUNTER — Telehealth: Payer: Self-pay | Admitting: Pulmonary Disease

## 2012-07-16 DIAGNOSIS — R042 Hemoptysis: Secondary | ICD-10-CM

## 2012-07-16 LAB — CBC
Hematocrit: 34 % (ref 34–45)
Hemoglobin: 11.4 g/dL (ref 11.2–15.7)
MCV: 90 fL (ref 79–95)
Platelets: 190 10*3/uL (ref 160–370)
RBC: 3.8 MIL/uL — ABNORMAL LOW (ref 3.9–5.2)
RDW: 13.4 % (ref 11.7–14.4)
WBC: 5.6 10*3/uL (ref 4.0–10.0)

## 2012-07-16 LAB — BASIC METABOLIC PANEL
Anion Gap: 9 (ref 7–16)
CO2: 24 mmol/L (ref 20–28)
Calcium: 8.5 mg/dL — ABNORMAL LOW (ref 8.6–10.2)
Chloride: 106 mmol/L (ref 96–108)
Creatinine: 0.77 mg/dL (ref 0.51–0.95)
GFR,Black: 98 *
GFR,Caucasian: 85 *
Glucose: 86 mg/dL (ref 60–99)
Lab: 14 mg/dL (ref 6–20)
Potassium: 4 mmol/L (ref 3.3–5.1)
Sodium: 139 mmol/L (ref 133–145)

## 2012-07-16 LAB — MEDICAL CYTOLOGY

## 2012-07-16 MED ORDER — LEVOFLOXACIN 750 MG PO TABS *I*
750.0000 mg | ORAL_TABLET | Freq: Every day | ORAL | Status: AC
Start: 2012-07-16 — End: 2012-07-21

## 2012-07-16 NOTE — Telephone Encounter (Signed)
This patient was seen by Dr. Blenda Nicely and Dr. Laural Roes for Hemoptysis and is being discharged today. They want her to follow up on 12/3 or 12/4 ideally. Please call the patient directly at [phone] to let her know when she could be seen.

## 2012-07-16 NOTE — Progress Notes (Addendum)
Patient seen & chart reviewed.  Interval history:Symptoms: Review of Systems :  Constitutional: Negative for fever and chills.   Respiratory: Negative for cough and shortness of breath. No hemoptysis  Cardiovascular: Negative for chest pain, palpitations and orthopnea.   Gastrointestinal: Negative for nausea, vomiting and abdominal pain.     Diet: Adequate intake.   Activity level: Returning to normal.   no BM in past 24 hours  Pain:       Filed Vitals:    07/15/12 2311 07/16/12 0000 07/16/12 0400 07/16/12 0730   BP: 123/66   103/58   Pulse: 73   70   Temp:    36.4 C (97.5 F)   TempSrc:    Temporal   Resp: 20 18 18 18    Height:       Weight:       SpO2: 94%   96%       Physical Exam:  General:AAO x3   Lungs: CTAB   Heart: RRR,no MRG   Abd: S/NT/ND   Ext: no e/c/c          Labs: reviewed      Recent Labs  Lab 07/16/12  0620 07/15/12  0158 07/14/12  0033   WBC 5.6 6.3 8.2   Hemoglobin 11.4 11.4 10.6*   Hematocrit 34 34 33*   Platelets 190 187 162         Lab results: 07/16/12  0620 07/15/12  0158 07/14/12  0033   Sodium 139 139 140   Potassium 4.0 3.8 4.1   Chloride 106 105 106   CO2 24 23 25    UN 14 15 10    Creatinine 0.77 0.85 0.89   GFR,Caucasian 85 75 71   GFR,Black 98 87 82   Glucose 86 95 97   Calcium 8.5* 8.6 9.1         Recent Labs  Lab 07/11/12  1632 07/11/12  1334   ALT 21 17   AST 28 21       Recent Labs  Lab 07/11/12  1632 07/11/12  1334   INR 1.1 1.1         Recent Labs  Lab 07/12/12  2128 07/11/12  1812   Glucose POCT 114* 114*         A/P:61 yo F h/o depression, presented with hemoptysis of unclear etiology and resultant anemia, no further episodes and stable Hct.   Plan    Hemoptysis:  -- unclear etiology, one mild episode this AM,will reconsult pulmonary   -- will need to f/u with Pulmonology for further imaging and possibly Thoracic surg for repeat bronchoscopy as O/P, may need referral from PCP   -- cont Moxi day 5/7 days to cover for CAP   Anemia ,blood loss d/t hemoptysis  -- stable   to be  further followed by PCP .   Constipation: bowel regimen  Dispo:if Medically stable ,D/C home,F/U in Pul.clinic as directed. F/U with PCP in a week with F/U CBC in 1 Wk  D/W covering APP  Prescriptions prior to admission   Medication Sig   . FLUoxetine (PROZAC) 20 MG capsule TAKE 3 CAPSULES DAILY   . amphetamine-dextroamphetamine (ADDERALL XR) 10 MG 24 hr capsule May sprinkle contents of capsule on food, do not chew entire capsule. Take one twice a day.  Code B  MDD 2pills   . ziprasidone (GEODON) 20 MG capsule TAKE 1 CAPSULE DAILY   . Calcium 500 MG tablet 2 times daily   .  amphetamine-dextroamphetamine (ADDERALL XR) 20 MG 24 hr capsule Take 1 capsule (20 mg total) by mouth every morning   Swallow capsules or contents whole.  Take 20 mg by mouth every morning  Code B MDD 1   . estrogen conjugated-medroxyPROGESTERone (PREMPRO) 0.3-1.5 MG per tablet Take 1 tablet by mouth daily       . albuterol (PROAIR HFA) 108 (90 BASE) MCG/ACT inhaler INHALE 2 PUFFS EVERY 4-6 HOURS PRN COUGH, WHEEZE, SOB, CHEST TIGHTNESS, SPACED 60 SECONDS APART.   Marland Kitchen Cholecalciferol (VITAMIN D) 1000 UNIT tablet TAKE 1 TABLET DAILY.   . Multiple Vitamin (MULTIVITAMIN) capsule TAKE 1 CAPSULE DAILY.     No prescriptions prior to admission     Scheduled Meds:     . polyethylene glycol  17 g Oral Daily   . lactobacillus rhamnosus (GG)  1 capsule Oral Daily   . amphetamine-dextroamphetamine  10 mg Oral Daily with breakfast   . amphetamine-dextroamphetamine  10 mg Oral Daily   . moxifloxacin  400 mg Oral Daily   . calcium citrate-vitamin D  1 tablet Oral BID   . cholecalciferol  1,000 Units Oral Daily   . multi-vitamin  1 tablet Oral Daily   . FLUoxetine  60 mg Oral Daily   . ziprasidone  20 mg Oral Daily

## 2012-07-16 NOTE — Discharge Summary (Signed)
Discharge Summary       Admit date: 07/11/2012         Discharge date and time: 07/16/2012 at 9:44 AM  Admitting Physician: Tobie Poet, MD   Discharge Attending: Costella Hatcher, MD    Patient: Jackie Moran Age: 61 y.o. Date of Birth: Sep 07, 1950 [id number]    Chief Complaint: coughing up blood   Principal Problem: Hemoptysis    Details of Admission: as per admission H&P    Discharge Diagnoses:  Active Hospital Problems    Diagnosis   . Hemoptysis   . Acute blood loss anemia   . Iron deficiency      Resolved Hospital Problems    Diagnosis   No resolved problems to display.         Hospital Course (including key diagnostic test results):  61 year old caucasian female with PMH of depression, food intolerance, and on hormonal therapy presenting with submassive hemoptysis.    Hemoptysis:  CT angio completed which ruled out pulmonary embolism.  [place] endobronchial clot in left lower lobe orifice seen on bronchoscopy.  Clot was left in place so as not to promote further bleeding.  Patient was started on moxifloxacin (started 07/11/12)  to cover CAP and anaerobes.  Hemoptysis improved.  Blood count has been stable.  Prempro was held and pt will follow-up with her GYN before restarting it.  She will need outpatient follow up with pulmonology for possible repeat imaging and/or bronchoscopy.  She has an appointment for follow-up with her PCP in one week and will have a repeat CBC prior to her appointment.       Acute blood loss anemia: Labs were monitored frequently and have now stabilized as hemoptysis has improved.  Pt will need further evaluation and monitoring by PCP.    Key Exam Findings at Discharge:    Vitals: Blood pressure 103/58, pulse 70, temperature 36.4 C (97.5 F), temperature source Temporal, resp. rate 18, height 1.6 m (5' 2.99"), weight 53.071 kg (117 lb), SpO2 96.00%.    Admission Weight: Weight: 53.071 kg (117 lb)  Discharge Weight: Weight: 53.071 kg (117 lb)     Physical Exam:   General:AAO  x3   Lungs: CTAB   Heart: RRR,no MRG   Abd: S/NT/ND   Ext: no e/c/c    Pending Test Results: CBC next week prior to PCP appointment    Consulting Providers: pulmonary/intensive care    Discharged Condition: stable    Discharge medications, instructions, and follow-up plans: as per After Visit Summary    Disposition: Home with no services    Signed: Claria Dice, PA  On: 07/16/2012  at: 9:43 AM

## 2012-07-16 NOTE — Plan of Care (Signed)
Reviewed discharge instructions, medications and follow up appointments with patient. Patient dressed, belongings packed. Friend will provide transportation home. Megan Mans RN, Care Coordinator [phone]

## 2012-07-16 NOTE — Discharge Instructions (Signed)
Brief Summary of Your Hospital Course: You were admitted for coughing up blood or hemoptysis.  You were monitored closely in the MICU.  You had a bronchoscopy which showed the presence of a clot which was not removed and treated with observation and antibiotics.  You were started on an antibiotic called Moxifloxacin for the possibility of pneumonia.  Your blood count was monitored closely and it did drop but it did not get low enough to require a transfusion and now has increased and stabilized since bleeding has stopped.  You will need to have a follow-up appt with pulmonology 1-2 weeks after discharge.    What to do after you leave the hospital:    Recommended diet: regular    Recommended activity: as tolerated    Home care:     If you experience any of these symptoms 24 hours or more after discharge: coughing up blood, chest pain, shortness of breath, dizziness, fainting, fever or chills call Saddie Benders, MD at [phone]      If you experience any of these symptoms within the first 24 hours after discharge call Costella Hatcher, MD at [phone]    Smoking    Smoking can increase your chances of developing chronic health problems or worsen conditions you already have.  If you smoke you should quit. Smoking cessation information has been given to you for your review to help you quit.  Medications to help you quit are available.  Ask your doctor if you would like to receive these medications.       Medications  Your doctor has prescribed medications to improve or manage your condition (such as antibiotics, pain medications, ACE inhibitors, beta blockers, diuretics and aspirin).  You should take them as prescribed by your doctor.  Ask your doctor for any questions regarding these medications.      Diet  A healthy diet is important to help you stay well.  Some health conditions require you to be on a special diet. For example, if you have heart failure you should monitor your fluid intake and limit the amount  of sodium including table salt.  This will help you avoid fluid retention which can cause shortness of breath or swelling of the feet and ankles.  Reading food labels is helpful when you are on a special diet.  Follow instructions from your doctor for any other special dietary requirement.    Exercise/Activity  Activity and exercise are important to your well being.  While you are in the hospital your activity may be restricted.  As your condition improves your activity level will be increased.  Most patients will be able to gradually resume activity as before.  You should follow your doctor's activity recommendations      Daily Weight  For some conditions (such as heart failure) you will need to weigh yourself every day at the same time on the same scale, preferably after you empty your bladder.  If you have an increase of 3 pounds in 2 days or 5 pounds in 1 week you should contact your physician.  A daily written log of your weights is a good way to keep track.  You should follow your doctor's recommendations on monitoring your weight.     What to do if your condition changes?  Once you leave the hospital you should contact your doctor's office to make a follow up appointment.  If at any time you have any questions or concerns or your condition gets worse,  contact your physician.  If you can not reach your physician or you develop life threatening symptoms such as trouble breathing or chest pain you should go to the closest Emergency Department.     Education  You may receive additional information on your specific condition before you are discharged.  Please ask your nurse or doctor for your information before you leave the hospital.

## 2012-07-18 LAB — BLOOD CULTURE
Bacterial Blood Culture: 0
Bacterial Blood Culture: 0

## 2012-07-20 LAB — LEGIONELLA CULTURE: Legionella Culture: 0

## 2012-07-22 ENCOUNTER — Ambulatory Visit
Admit: 2012-07-22 | Discharge: 2012-07-22 | Disposition: A | Discharge status: Home / Self Care | Payer: Self-pay | Source: Ambulatory Visit | Attending: Hospitalist | Admitting: Hospitalist

## 2012-07-22 DIAGNOSIS — R042 Hemoptysis: Secondary | ICD-10-CM

## 2012-07-22 LAB — CBC AND DIFFERENTIAL
Baso # K/uL: 0.1 10*3/uL (ref 0.0–0.1)
Basophil %: 1.1 % (ref 0.1–1.2)
Eos # K/uL: 0.1 10*3/uL (ref 0.0–0.4)
Eosinophil %: 1.1 % (ref 0.7–5.8)
Hematocrit: 37 % (ref 34–45)
Hemoglobin: 11.9 g/dL (ref 11.2–15.7)
Lymph # K/uL: 0.9 10*3/uL — ABNORMAL LOW (ref 1.2–3.7)
Lymphocyte %: 15.9 % — ABNORMAL LOW (ref 19.3–51.7)
MCV: 91 fL (ref 79–95)
Mono # K/uL: 0.6 10*3/uL (ref 0.2–0.9)
Monocyte %: 11.2 % (ref 4.7–12.5)
Neut # K/uL: 4 10*3/uL (ref 1.6–6.1)
Platelets: 306 10*3/uL (ref 160–370)
RBC: 4 MIL/uL (ref 3.9–5.2)
RDW: 13.5 % (ref 11.7–14.4)
Seg Neut %: 70.7 % (ref 34.0–71.1)
WBC: 5.7 10*3/uL (ref 4.0–10.0)

## 2012-07-23 ENCOUNTER — Encounter: Payer: Self-pay | Admitting: Primary Care

## 2012-07-23 ENCOUNTER — Ambulatory Visit: Payer: Self-pay | Admitting: Primary Care

## 2012-07-23 VITALS — BP 106/54 | HR 84 | Temp 98.5°F | Ht 63.5 in | Wt 116.2 lb

## 2012-07-23 DIAGNOSIS — R042 Hemoptysis: Secondary | ICD-10-CM

## 2012-07-23 MED ORDER — AMPHETAMINE-DEXTROAMPHETAMINE 10 MG PO CP24 *I*
10.0000 mg | ORAL_CAPSULE | Freq: Two times a day (BID) | ORAL | Status: DC
Start: 2012-07-23 — End: 2012-10-21

## 2012-07-23 MED ORDER — NON-SYSTEM MEDICATION *A*
Status: DC
Start: 2012-07-23 — End: 2012-10-21

## 2012-07-23 NOTE — Patient Instructions (Addendum)
should f/u with Dr Neysa Hotter, Dr Shawnee Knapp is  Pulmonary attending in pulmonary clinic, may call [phone] to schedule. Will likely receive f/u CT and/or bronchoscopy help rule additional contributing factor to this episode of hemoptysis   .    Do not take aspriin, ibuprofen, aleve, advil or motrin

## 2012-07-23 NOTE — Progress Notes (Signed)
Jackie Moran is a 61 y.o. female Followup after hospitalization from November 14. She was admitted for hemoptysis that appeared to be originating from the left lower lung. Etiology was felt to be infection since bronchoalveolar lavage during bronchoscopy revealed gram-positive cocci. Cultures were negative. Clot was noted to be filling the airway to the left lower lobe. CT scans did not suggest pulmonary embolism or malignancy. Screening evaluation for autoimmune disease was negative. She has finished a ten-day course of Levaquin 750 mg daily.    I am not completely comfortable that the etiology of the hemoptysis was pneumonia. She did have have a low-grade fever and elevated white blood cell count, however she did not have a productive cough with phlegm, no chills no sweats or symptoms of typical respiratory infection.    She has not coughed up any blood since discharge. She remains fatigued with no energy. She is not short of breath. She has remained out of work because of continued fatigue. She has not called yet for appointment with Dr. Blenda Nicely pulmonary fellow for followup. I agree with plans for repeat CT scan and possible repeat bronchoscopy after she's had a chance to recover from her acute illness. I believe assurance is that there is no endobronchial lesion is necessary.      Patient Active Problem List   Diagnosis Code   . Allergic Rhinitis 477.9   . 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 Gluten intolerance 579.0   . Fatigue 780.79   . Posterior vitreous detachment of left eye 379.21   . Hemoptysis 786.30   . Acute blood loss anemia 285.1   . Iron deficiency 280.9       ALLERGIES:  Egg; Gluten meal; Milk; Amoxicillin; Doxycycline hyclate; Penicillins; and No known latex allergy    Current Outpatient Prescriptions on File Prior to Visit   Medication Sig Dispense Refill   . FLUoxetine (PROZAC) 20 MG capsule TAKE 3 CAPSULES DAILY  270 capsule  2   .  amphetamine-dextroamphetamine (ADDERALL XR) 10 MG 24 hr capsule May sprinkle contents of capsule on food, do not chew entire capsule. Take one twice a day.  Code B  MDD 2pills  180 capsule  0   . ziprasidone (GEODON) 20 MG capsule TAKE 1 CAPSULE DAILY  90 capsule  3   . Calcium 500 MG tablet 2 times daily       . albuterol (PROAIR HFA) 108 (90 BASE) MCG/ACT inhaler INHALE 2 PUFFS EVERY 4-6 HOURS PRN COUGH, WHEEZE, SOB, CHEST TIGHTNESS, SPACED 60 SECONDS APART.  1  0   . [DISCONTINUED] nabumetone (RELAFEN) 500 MG tablet TAKE 1 TABLET TWICE DAILY. using prn  60  3   . Cholecalciferol (VITAMIN D) 1000 UNIT tablet TAKE 1 TABLET DAILY.  1  0   . Multiple Vitamin (MULTIVITAMIN) capsule TAKE 1 CAPSULE DAILY.    0     No current facility-administered medications on file prior to visit.     Medications reviewed, reconciled with patients and changes were made      Exam:  Filed Vitals:    07/23/12 1614   BP: 106/54   Pulse: 84   Temp: 36.9 C (98.5 F)   Height: 1.613 m (5' 3.5")   Weight: 52.708 kg (116 lb 3.2 oz)    Body mass index is 20.26 kg/(m^2).    SpO2 Readings from Last 3 Encounters:   07/23/12 96%   07/16/12 96%   07/12/12 97%  Appears well in no distress.  HEENT: No adenopathy in the anterior-posterior cervical chain.  No clavicular adenopathy.  Oropharynx clear  Neck veins flat.  No thyroid nodules.    Heart: RRR S1-S2 without murmurs  Lungs: Clear to auscultation    Hospital Outpatient Visit on 07/22/2012   Component Date Value Range Status   . WBC 07/22/2012 5.7  4.0 - 10.0 THOU/uL Final   . RBC 07/22/2012 4.0  3.9 - 5.2 MIL/uL Final   . Hemoglobin 07/22/2012 11.9  11.2 - 15.7 g/dL Final   . Hematocrit 16/05/9603 37  34 - 45 % Final   . MCV 07/22/2012 91  79 - 95 fL Final   . RDW 07/22/2012 13.5  11.7 - 14.4 % Final   . Platelets 07/22/2012 306  160 - 370 THOU/uL Final   . Seg Neut % 07/22/2012 70.7  34.0 - 71.1 % Final   . Lymphocyte % 07/22/2012 15.9* 19.3 - 51.7 % Final   . Monocyte % 07/22/2012 11.2  4.7 -  12.5 % Final   . Eosinophil % 07/22/2012 1.1  0.7 - 5.8 % Final   . Basophil % 07/22/2012 1.1  0.1 - 1.2 % Final   . Neut # K/uL 07/22/2012 4.0  1.6 - 6.1 THOU/uL Final   . Lymph # K/uL 07/22/2012 0.9* 1.2 - 3.7 THOU/uL Final   . Mono # K/uL 07/22/2012 0.6  0.2 - 0.9 THOU/uL Final   . Eos # K/uL 07/22/2012 0.1  0.0 - 0.4 THOU/uL Final   . Baso # K/uL 07/22/2012 0.1  0.0 - 0.1 THOU/uL Final           1. Hemoptysis      Hemostasis from the left lower lobe. No recurrent bleeding. She has finished a course of Levaquin for gram-positive cocci in the bronchoalveolar lavage. CBC has returned to normal with normalization of white blood cell count and return to normal of hematocrit    She remains fatigued. Will set return to work date as December 9.    Advised she should call the pulmonary Department for followup woman with Dr. Blenda Nicely. Advised she likely will have repeat CT scan and possibly repeat bronchoscopy. Cautioned her to avoid all NSAIDs and aspirin

## 2012-07-24 NOTE — Telephone Encounter (Signed)
Forwarded to Drs. Glickman/Trawick to advise.  Dr. Blenda Nicely has nothing available next week.

## 2012-07-24 NOTE — Telephone Encounter (Signed)
This patient is waiting to hear if she could be seen next week. Please call her at the number below and let her know.

## 2012-07-29 ENCOUNTER — Ambulatory Visit: Payer: Self-pay | Admitting: Primary Care

## 2012-07-29 ENCOUNTER — Telehealth: Payer: Self-pay | Admitting: Pulmonary Disease

## 2012-07-29 NOTE — Telephone Encounter (Signed)
Office received a fax from answering servict dated 07/26/2012.Patient requested a call back from MD Blenda Nicely to discuss health care. Should follow up in Pulmonary for a CT and possibly a bronch.

## 2012-07-31 NOTE — Telephone Encounter (Signed)
Patient would like a call back at [phone] with an appointment with Dr. Blenda Nicely. She said that the only time she can not come is tomorrow afternoon.

## 2012-07-31 NOTE — Telephone Encounter (Signed)
Spoke to patient, let her know that I just spoke with Dr. Blenda Nicely and that he is in the ICU this week, his schedule is very limited and that he has not been able to come up with a date/time to schedule her.  Per Dr. Blenda Nicely, he will give me a time to schedule her by the end of this week for next week, also she will be needing a repeat Chest CT.  Patient was ok with this.

## 2012-08-01 ENCOUNTER — Telehealth: Payer: Self-pay

## 2012-08-01 NOTE — Telephone Encounter (Signed)
Left a message for patient to give me a call confirming an appointment with Dr. Blenda Nicely on 08/06/12 at 1:30 and if she is able to come in tomorrow or Monday to have her Chest CT.

## 2012-08-01 NOTE — Telephone Encounter (Signed)
Spoke to Tiz at [employer], policy doesn't require prior authorization for Chest CT, but predetermination is recommended but not required.

## 2012-08-01 NOTE — Telephone Encounter (Signed)
Spoke with pt, still very low energy, tired easily, but improving slowly. No more cough or blood.    We will see her in the clinic next wk for follow-up, will also repeat non-contrast CT chest to evaluate for resolution of PNA and bleeding.     Roselle Locus, MD  Fellow, Pulmonary and Critical Care Medicine   08/01/2012 10:46 AM

## 2012-08-01 NOTE — Addendum Note (Signed)
Addended by: Aron Baba on: 08/01/2012 10:52 AM     Modules accepted: Orders

## 2012-08-02 ENCOUNTER — Telehealth: Payer: Self-pay

## 2012-08-02 NOTE — Telephone Encounter (Signed)
Patient returned my call, she has been scheduled for her Chest CT on 08/05/12 at 9:45 am ([facility]).

## 2012-08-03 LAB — VIRUS CULTURE: Virus Culture: 0

## 2012-08-05 LAB — FUNGUS CULTURE: Fungal Culture: 0

## 2012-08-06 ENCOUNTER — Encounter: Payer: Self-pay | Admitting: Pulmonary Disease

## 2012-08-06 ENCOUNTER — Ambulatory Visit: Payer: Self-pay | Admitting: Pulmonary Disease

## 2012-08-06 VITALS — BP 124/70 | HR 78 | Temp 96.4°F | Resp 18 | Ht 63.0 in | Wt 113.0 lb

## 2012-08-06 NOTE — Patient Instructions (Addendum)
We will follow up the spots in your lungs with another cat scan in 3 months.    Copay received today:  $40.00 MasterCard
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
## 2012-08-14 ENCOUNTER — Telehealth: Payer: Self-pay | Admitting: Pulmonary Disease

## 2012-08-14 NOTE — Telephone Encounter (Signed)
[employer] called and needs another work release form. He filled out other forms for me stating that I could return to work part-time starting on December 23rd. He also indicated that I could be released for full-time for January 15th  Please send another letter with this information on it to [facility] Benefits Dept., Attn: Bernadette Hoit,  Fax: [phone] fax number. Need by ASAP. Please call patient with the status. Annissa Reis (Self) [phone] Judie Petit)

## 2012-08-15 NOTE — Telephone Encounter (Signed)
Routed to Dr. Blenda Nicely for follow-up.

## 2012-08-16 ENCOUNTER — Telehealth: Payer: Self-pay | Admitting: Pulmonary Disease

## 2012-08-16 NOTE — Telephone Encounter (Signed)
Patient will start work part time starting 12/23.  She will need a note stating that she can start next Monday.  Must note there will be no restrictions as of 1/15 & that patient will resume as full time employee.  Please send to [facility] Benefits Dept., Attn: Bernadette Hoit [phone] fax.

## 2012-08-22 NOTE — Telephone Encounter (Signed)
[facility]  Department of Pulmonology  [address]  [place] [address]  Phone: [phone]     Date: 08/15/2012    Patient: Jackie Moran   Date of Birth: Nov 26, 1950       To Whom It May Concern:    Rocky Morel was recently hospitalized and treated as an inpatient. At this time, it is medically appropriate for her to return to work. I have requested that she work part time until September 11, 2012, after which she will be able to return to full-time duties without restrictions.    Sincerely,         Aron Baba, MD

## 2012-08-23 NOTE — Telephone Encounter (Signed)
Faxed letter to [facility] [employer] @ [employer] Benefits Dept.

## 2012-09-04 LAB — AFB CULTURE: AFB Culture: 0

## 2012-10-21 ENCOUNTER — Encounter: Payer: Self-pay | Admitting: Primary Care

## 2012-10-21 ENCOUNTER — Ambulatory Visit: Payer: Self-pay | Admitting: Primary Care

## 2012-10-21 VITALS — BP 116/66 | HR 81 | Temp 98.0°F | Ht 63.5 in | Wt 120.0 lb

## 2012-10-21 DIAGNOSIS — F3289 Other specified depressive episodes: Secondary | ICD-10-CM

## 2012-10-21 MED ORDER — AMPHETAMINE-DEXTROAMPHETAMINE 15 MG PO CP24 *I*
15.0000 mg | ORAL_CAPSULE | Freq: Every morning | ORAL | Status: DC
Start: 2012-10-21 — End: 2012-10-22

## 2012-10-21 NOTE — Progress Notes (Signed)
Jackie Moran is a 62 y.o. female Followup to monitor depression and for  episode of hemoptysis in the early winter.    She's had no recurrent hemoptysis and really probably without cough. However she states she still feels quite fatigue has not recovered from that event. She is scheduled for repeat CT scan of the chest March 17.    Her mood overall is good she says the winter it has been long. She does not like to walk out in cold weather. She has been taking walks in the mall like to be more active.      Patient Active Problem List   Diagnosis Code   . Allergic Rhinitis 477.9   . 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 Gluten intolerance 579.0   . Fatigue 780.79   . Posterior vitreous detachment of left eye 379.21   . Hemoptysis 786.30       ALLERGIES:  Egg; Gluten meal; Milk; Amoxicillin; Doxycycline hyclate; Penicillins; and No known latex allergy    Current Outpatient Prescriptions on File Prior to Visit   Medication Sig Dispense Refill   . amphetamine-dextroamphetamine (ADDERALL XR) 15 MG 24 hr capsule Take 1 capsule (15 mg total) by mouth every morning   MDD 30 mg  Code B  180 capsule  0   . FLUoxetine (PROZAC) 20 MG capsule TAKE 3 CAPSULES DAILY  270 capsule  2   . ziprasidone (GEODON) 20 MG capsule TAKE 1 CAPSULE DAILY  90 capsule  3   . Calcium 500 MG tablet 2 times daily       . [DISCONTINUED] nabumetone (RELAFEN) 500 MG tablet TAKE 1 TABLET TWICE DAILY. using prn  60  3   . Cholecalciferol (VITAMIN D) 1000 UNIT tablet TAKE 1 TABLET DAILY.  1  0   . Multiple Vitamin (MULTIVITAMIN) capsule TAKE 1 CAPSULE DAILY.    0     No current facility-administered medications on file prior to visit.     Medications reviewed, reconciled with patients and changes were made      Exam:  Filed Vitals:    10/21/12 1556   BP: 116/66   Pulse: 81   Temp: 36.7 C (98 F)   Height: 1.613 m (5' 3.5")   Weight: 54.432 kg (120 lb)    Body mass index is 20.92 kg/(m^2).    SpO2 Readings from Last 3  Encounters:   10/21/12 98%   08/06/12 100%   07/23/12 96%       Appears well in no distress.  HEENT: No adenopathy in the anterior-posterior cervical chain.  No clavicular adenopathy.  Oropharynx clear  Neck veins flat.  No thyroid nodules.    Heart: RRR S1-S2 without murmurs  Lungs: Clear to auscultation          1. Depression- in remission      Impression doing quite well on current medications. However she would like to increase the dose of Adderall to 15 mg twice daily instead of 10 mg. That sounds reasonable given the long winter.    Hemoptysis has not been recurrent. Likely final CT scan will be March 17

## 2012-10-22 ENCOUNTER — Telehealth: Payer: Self-pay | Admitting: Primary Care

## 2012-10-22 MED ORDER — AMPHETAMINE-DEXTROAMPHETAMINE 15 MG PO CP24 *I*
15.0000 mg | ORAL_CAPSULE | Freq: Two times a day (BID) | ORAL | Status: DC
Start: 2012-10-22 — End: 2013-01-27

## 2012-10-22 NOTE — Telephone Encounter (Signed)
corrected

## 2012-10-22 NOTE — Telephone Encounter (Signed)
Pt called to request new script for Adderall XR15  Mg, the directions say 1 cap every morning, script is for qty of 180.  She takes one morn and 1 in the afternoon will need new script with correct directions.  Pt asked to pick up new script tomorrow to mail to her mail order Co.  Pt asked for a call to confirm ready

## 2012-10-22 NOTE — Telephone Encounter (Signed)
Notified patient, and put script up front.

## 2012-11-15 ENCOUNTER — Ambulatory Visit: Payer: Self-pay | Admitting: Pulmonary Disease

## 2012-11-15 ENCOUNTER — Encounter: Payer: Self-pay | Admitting: Pulmonary Disease

## 2012-11-15 VITALS — BP 131/57 | HR 87 | Temp 98.1°F | Resp 12 | Ht 63.5 in | Wt 119.0 lb

## 2012-11-15 NOTE — Progress Notes (Addendum)
PULMONARY CLINIC   FOLLOW-UP VISIT      Dear Dr. Langston Masker,    We had the pleasure of seeing your patient, Jackie Moran, on 11/15/2012 in the [facility] in follow-up for hemoptysis and lung nodules.      HISTORY     As you know, Jackie  Moran is a 62 y.o. female with depression and 2 episodes of hemoptysis. The recent, large episode of hemoptysis was in November. Work-up was largely unremarkable and she improved with a course of antibiotics though her presentation was by no means classic for PNA. Nevertheless, there was no further hemoptysis and her fatigue and dyspnea have slowly improved over the past few months since treatment. Currently, Jackie Moran feels back to her baseline state of health. Though it has been slow to improve, she finally feels her energy and exercise tolerance are normal. She has had no further hemoptysis, is not coughing, denies fevers or chills. Her breathing feels a little odd in very cold weather, but this has not been an issue recently. She may have gained a couple of pounds recently and is eating well. She has had hot flashes since we discontinued her hormone replacement in the hospital, but has started an herbal supplement (Menocets) that seems to help.      Allergies   Allergen Reactions   . Egg Hives     Pt notes allergy to eggs.  Rxn = hives.  Wishes to have strict allergy protocol.  SGuilbert, RD, [phone]   . Gluten Meal Other (See Comments)     Pt thinks she has celiac disease-bloodwork was inconclusive.  She wishes to follow a strict gluten-free diet as she feels better eliminating gluten.  Glo Herring, RD, [phone]   . 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 Rxn varies-sometimes causes diarrhea, sometimes causes constipation, and hives may occur. Wishes to have strict allergy protocol.  SGuilbert, RD, [phone]   . Amoxicillin    . Doxycycline Hyclate       Nausea;    . Penicillins    . No Known Latex Allergy         Current Outpatient Prescriptions   Medication   . Ascorbic Acid (VITAMIN C PO)   . Probiotic Product (PROBIOTIC DAILY PO)   . amphetamine-dextroamphetamine (ADDERALL XR) 15 MG 24 hr capsule   . FLUoxetine (PROZAC) 20 MG capsule   . ziprasidone (GEODON) 20 MG capsule   . Calcium 500 MG tablet   . Cholecalciferol (VITAMIN D) 1000 UNIT tablet   . Multiple Vitamin (MULTIVITAMIN) capsule   . [DISCONTINUED] nabumetone (RELAFEN) 500 MG tablet     No current facility-administered medications for this visit.       ROS: A 10-point review of systems is as per HPI, and is otherwise negative.     PHYSICAL EXAM & DATA     Vital signs: Blood pressure 131/57, pulse 87, temperature 36.7 C (98.1 F), temperature source Temporal, resp. rate 12, height 1.613 m (5' 3.5"), weight 53.978 kg (119 lb), SpO2 97.00%. on RA.  Body mass index is 20.75 kg/(m^2).   GEN: Well-dressed, pleasant, in no distress.  HEENT: Mucus membranes are most, oropharynx without inflammation. No LAD in neck.  CV: Regular s1 and s2 without murmur.  Pulm: CTAB.  Abd: Nondistended.  Ext: No edema, pulses intact.  Neuro: Grossly intact, symmetric, normal gait.  Psych: Affect appropriate, though seems mildly anxious as usual.  Data include the following:    Recent Labs:  Lab Results   Component Value Date    WBC 5.7 07/22/2012    HGB 11.9 07/22/2012    HCT 37 07/22/2012    PLT 306 07/22/2012    CHOL 180 01/15/2012    TRIG 73 01/15/2012    HDL 90 01/15/2012    ALT 21 07/11/2012    AST 28 07/11/2012    NA 139 07/16/2012    K 4.0 07/16/2012    CL 106 07/16/2012    CREAT 0.77 07/16/2012    CA 8.5* 07/16/2012    UN 14 07/16/2012    CO2 24 07/16/2012    TSH 0.80 01/15/2012    INR 1.1 07/11/2012    HA1C 5.3 01/15/2012     Imaging: Personally reviewed.  CT chest without contrast shows about a dozen stable, small nodules, as well as an irregular inferior RML nodule that is larger from the last scan.         ASSESSMENT & PLAN:     Jackie Moran is a 62 y.o. female with  anxiety and 2 episodes of hemoptysis. Clinically, she is doing extremely well and her symptoms seem finally to have resolved. Unfortunately, her CT chest shows progression of a RML nodule. She does not have risk factors for lung cancer, and the appearance of the lesion is not generally suggestive of a malignancy. Statistically, it was always most likely that her hemoptysis was due to PNA, even though her presentation was odd. As we get further from the event and see evidence of chronic changes on imaging, I wonder more about a mild chronic infectious process (such as a fungus or non-tuberculous mycobacterium). Even if this were the case, I would not recommend any invasive testing given how well she is doing. We recommend:    - Repeat CT chest without contrast in 3 months to monitor the RML nodule.  - We will send an ANCA screen as this does not appear to have been sent on her prior rheumatologic work-up, and vasculitis could present like this.    Thank you for the opportunity to participate in Jackie Moran's care. We will schedule follow-up for 3 months. Please contact us if you have any other questions or concerns.     Sincerely,    Roselle Locus, MD  Fellow, Pulmonary and Critical Care Medicine    PULMONARY [facility] ATTENDING ATTESTATION    I saw and examined the patient personally and edited the fellow's note. I agree with the exam, history, assessment, and plan documented by the fellow. I personally reviewed and agree with the fellow's interpretation of the pulmonary function tests and radiographic studies.     Chyla Schlender, MD

## 2012-11-15 NOTE — Patient Instructions (Signed)
You're doing great. We will check one blood test. We will also repeat the cat scan in 3 months to insure there is no progression.    Please call if you develop any concerning symptoms.

## 2012-12-09 ENCOUNTER — Telehealth: Payer: Self-pay | Admitting: Pulmonary Disease

## 2012-12-09 NOTE — Telephone Encounter (Signed)
Patient is checking the status of an appt for a ct scan. She has an fua with Dr. Blenda Nicely on 03/21/13. Please call her first before scheduling. She would like to go to [facility] in [place]. Kenijah Sawdon (Self) [phone] (H)

## 2012-12-10 NOTE — Telephone Encounter (Signed)
Spoke to patient, she states that Dr. Blenda Nicely wanted her to have another in 3-4 months after her scan done on 11/11/12.  I don't see another order in the system, so I will send this message to Dr. Blenda Nicely, to place a new order for CT Chest scan.

## 2012-12-13 ENCOUNTER — Other Ambulatory Visit: Payer: Self-pay | Admitting: Pulmonary Disease

## 2012-12-13 DIAGNOSIS — R042 Hemoptysis: Secondary | ICD-10-CM

## 2012-12-13 DIAGNOSIS — R918 Other nonspecific abnormal finding of lung field: Secondary | ICD-10-CM

## 2012-12-17 ENCOUNTER — Telehealth: Payer: Self-pay | Admitting: Pulmonary Disease

## 2012-12-17 NOTE — Telephone Encounter (Signed)
Patient called to speak to [facility] in regards to her CT scan. Patient is requesting to have the scan done at [facility] instead of the hospital location.   Please call patient back with new date/time at [phone].  A message can be left on her answering machine.

## 2012-12-17 NOTE — Telephone Encounter (Signed)
Left a message for the patient, Dr. Blenda Nicely wants her to have the repeat CT in early July.  CT Chest has been scheduled for 03/04/13 at 10:15 am [facility]).  Appointment letter mailed.  Left the number for Radiology if the patient needs to reschedule.

## 2012-12-17 NOTE — Telephone Encounter (Signed)
Left a message for patient, per her request CT Chest has been rescheduled for the same date/time (03/04/13 at 10:15 am), at [facility].  Let patient know that I already sent out an appointment letter with the [facility] location, so to please note the location change.

## 2012-12-24 ENCOUNTER — Ambulatory Visit
Admit: 2012-12-24 | Discharge: 2012-12-24 | Disposition: A | Discharge status: Home / Self Care | Payer: Self-pay | Source: Ambulatory Visit | Attending: Pulmonary Disease | Admitting: Pulmonary Disease

## 2012-12-24 DIAGNOSIS — R042 Hemoptysis: Secondary | ICD-10-CM

## 2012-12-25 LAB — ANCA SCREEN: ANCA Screen: NEGATIVE

## 2013-01-27 ENCOUNTER — Ambulatory Visit: Payer: Self-pay | Admitting: Primary Care

## 2013-01-27 ENCOUNTER — Encounter: Payer: Self-pay | Admitting: Primary Care

## 2013-01-27 VITALS — BP 122/70 | HR 79 | Temp 99.2°F | Ht 63.5 in | Wt 117.4 lb

## 2013-01-27 DIAGNOSIS — R918 Other nonspecific abnormal finding of lung field: Secondary | ICD-10-CM

## 2013-01-27 DIAGNOSIS — R5383 Other fatigue: Secondary | ICD-10-CM

## 2013-01-27 MED ORDER — FLUOXETINE HCL 20 MG PO CAPS *I*
60.0000 mg | ORAL_CAPSULE | Freq: Every day | ORAL | Status: DC
Start: 2013-01-27 — End: 2013-07-02

## 2013-01-27 MED ORDER — AMPHETAMINE-DEXTROAMPHETAMINE 15 MG PO CP24 *I*
15.0000 mg | ORAL_CAPSULE | Freq: Two times a day (BID) | ORAL | Status: DC
Start: 2013-01-27 — End: 2013-04-29

## 2013-01-27 NOTE — Progress Notes (Signed)
Jackie Moran is a 62 y.o. female Comes in for regular followup of fatigue, depression that remains in remission. Overall she is feeling well. She still hears to a rigid gluten-free diet. She has symptoms of gluten intolerance and sensitivity although has not been  diagnosed with celiac disease. She notes bloating and abdominal discomfort even with foods with a slight amount of gluten such as Lipton Onion Sop mix.    She's had no recurrent hemoptysis. Scheduled for another CT scan in July to followup on pulmonary nodules. She has no cough. No shortness of breath she still feels fatigue.    She takes Adderall also for depression and is doing well on the current dose.      Patient Active Problem List   Diagnosis Code   . Allergic Rhinitis 477.9   . 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 Gluten intolerance 579.0   . Fatigue 780.79   . Posterior vitreous detachment of left eye 379.21   . Hemoptysis 786.30   . Pulmonary nodules 793.19       ALLERGIES:  Egg; Gluten meal; Milk; Amoxicillin; Doxycycline hyclate; Penicillins; and No known latex allergy    Current Outpatient Prescriptions on File Prior to Visit   Medication Sig Dispense Refill   . Ascorbic Acid (VITAMIN C PO) Take by mouth       . Probiotic Product (PROBIOTIC DAILY PO) Take by mouth       . amphetamine-dextroamphetamine (ADDERALL XR) 15 MG 24 hr capsule Take 1 capsule (15 mg total) by mouth 2 times daily   MDD 30 mg  Code B  180 capsule  0   . FLUoxetine (PROZAC) 20 MG capsule TAKE 3 CAPSULES DAILY  270 capsule  2   . ziprasidone (GEODON) 20 MG capsule TAKE 1 CAPSULE DAILY  90 capsule  3   . Calcium 500 MG tablet 2 times daily       . [DISCONTINUED] nabumetone (RELAFEN) 500 MG tablet TAKE 1 TABLET TWICE DAILY. using prn  60  3   . Cholecalciferol (VITAMIN D) 1000 UNIT tablet TAKE 1 TABLET DAILY.  1  0   . Multiple Vitamin (MULTIVITAMIN) capsule TAKE 1 CAPSULE DAILY.    0     No current facility-administered medications on  file prior to visit.     Medications reviewed, reconciled with patients and changes were made      Exam:  Filed Vitals:    01/27/13 1545   BP: 122/70   Pulse: 79   Temp: 37.3 C (99.2 F)   Height: 1.613 m (5' 3.5")   Weight: 53.252 kg (117 lb 6.4 oz)    Body mass index is 20.47 kg/(m^2).    SpO2 Readings from Last 3 Encounters:   01/27/13 99%   11/15/12 97%   10/21/12 98%     Appears well in no distress.  HEENT: No adenopathy in the anterior-posterior cervical chain.  No clavicular adenopathy.  Oropharynx clear  Neck veins flat.  No thyroid nodules.   Heart: RRR S1-S2 without murmurs  Lungs: Clear to auscultation        1. Fatigue      Overall she remains stable. Continued fatigue after episode of hemoptysis this winter. Depression remains in remission no change in medications. Followup in 3-4 months. Check CT of the chest in July for pulmonary nodules

## 2013-03-10 ENCOUNTER — Encounter: Payer: Self-pay | Admitting: Pulmonary Disease

## 2013-03-10 NOTE — Progress Notes (Signed)
CT chest for lung nodules    Reviewed Ms Arrowood's f/u CT chest for nodule surveillance performed 7/8. LUL spiculated nodules and RUL 4mm ground-glass nodule are stable since 3/17, and some seem improved since 08/05/2012. There are no new or larger nodules.    Overall, if there is no worsening of nodular disease, she likely needs f/u of spiculated nodules for 2 years from 08/05/2012, but f/u of RUL ground-glass nodule for 3 years from 11/12/2011 (official read states it became more prominent at this time).    Plan:  - will review results with Ms Rubye Oaks at her f/u appt this month  - will plan to repeat imaging in 6 months, and continue at least through 10/2014    Roselle Locus, MD  Fellow, Pulmonary and Critical Care Medicine

## 2013-03-12 ENCOUNTER — Ambulatory Visit
Admit: 2013-03-12 | Discharge: 2013-03-12 | Disposition: A | Discharge status: Home / Self Care | Payer: Self-pay | Source: Ambulatory Visit | Attending: Obstetrics and Gynecology | Admitting: Obstetrics and Gynecology

## 2013-03-18 LAB — GYN CYTOLOGY

## 2013-03-21 ENCOUNTER — Encounter: Payer: Self-pay | Admitting: Pulmonary Disease

## 2013-03-21 ENCOUNTER — Ambulatory Visit: Payer: Self-pay | Admitting: Pulmonary Disease

## 2013-03-21 VITALS — BP 112/60 | HR 89 | Temp 97.9°F | Resp 12 | Ht 63.5 in | Wt 118.0 lb

## 2013-03-21 DIAGNOSIS — R918 Other nonspecific abnormal finding of lung field: Secondary | ICD-10-CM

## 2013-03-21 DIAGNOSIS — K219 Gastro-esophageal reflux disease without esophagitis: Secondary | ICD-10-CM

## 2013-03-21 MED ORDER — OMEPRAZOLE 20 MG PO CPDR *I*
20.0000 mg | DELAYED_RELEASE_CAPSULE | Freq: Every day | ORAL | Status: DC
Start: 2013-03-21 — End: 2014-04-17

## 2013-03-21 NOTE — Patient Instructions (Signed)
We will try Prilosec (omeprazole) 20mg  daily (30 minutes before eating) to see if you have any acid reflux.    We will repeat a CT chest in 7 months.

## 2013-03-21 NOTE — Progress Notes (Addendum)
PULMONARY CLINIC   FOLLOW-UP VISIT      Dear Dr. Langston Masker,    We had the pleasure of seeing your patient, Jackie Moran, on 03/21/2013 in the [facility] in follow-up for pulmonary nodules and episode of hemolysis.      HISTORY     As you know, Jackie Moran is a 62 y.o. female with depression, allergic rhinitis, and chronic fatigue, who I met during a hospitalization last November for a single episode of hemolysis. Today, Jackie Moran thinks she may have a URI. She feels fatigued (beyond her baseline), has a cough productive of yellow-brown phlegm, a mild chest ache that is worse with the coughing, and general achiness. She has had these symptoms for about a week, they have neither improved nor worsened. She denies rhinorrhea and congestion. She thinks she did have post-nasal drip, but this has now resolved. I asked about reflux symptoms; she denies heart burn or pain, but thinks she may cough more when lying down.    Otherwise, she feels generally unchanged. She has never had recurrence of hemoptysis. We reviewed in depth the CAT scan she had performed on July 8th; she was pleased overall but understands that we are recommending further imaging to demonstrate stability of her nodules. She has asked that we space these out as much as seems reasonable, as she is worried about adverse reactions from the radiation associated with the scans. She went on to say that if she was ever found to have cancer, she would not want treatment. However, after probing further, she really just wants to avoid the burdensome treatment regimens that she has seen friends go through without much perceivable benefit. She agreed that if a malignancy were likely curable, she would want to pursue treatment.      Allergies   Allergen Reactions   . Egg Hives     Pt notes allergy to eggs.  Rxn = hives.  Wishes to have strict allergy protocol.  SGuilbert, RD, [phone]   . Gluten Meal Other (See Comments)     Pt thinks she has  celiac disease-bloodwork was inconclusive.  She wishes to follow a strict gluten-free diet as she feels better eliminating gluten.  Glo Herring, RD, [phone]   . 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 Rxn varies-sometimes causes diarrhea, sometimes causes constipation, and hives may occur. Wishes to have strict allergy protocol.  SGuilbert, RD, [phone]   . Amoxicillin    . Doxycycline Hyclate       Nausea;    . Penicillins    . Environmental Allergies Other (See Comments)     Sneezing, post nasal drip   . No Known Latex Allergy        Current Outpatient Prescriptions   Medication   . estrogen conjugated-medroxyPROGESTERone (PREMPRO) 0.3-1.5 MG per tablet   . FLUoxetine (PROZAC) 20 MG capsule   . amphetamine-dextroamphetamine (ADDERALL XR) 15 MG 24 hr capsule   . Ascorbic Acid (VITAMIN C PO)   . Probiotic Product (PROBIOTIC DAILY PO)   . ziprasidone (GEODON) 20 MG capsule   . Calcium 500 MG tablet   . Cholecalciferol (VITAMIN D) 1000 UNIT tablet   . Multiple Vitamin (MULTIVITAMIN) capsule   . [DISCONTINUED] nabumetone (RELAFEN) 500 MG tablet     No current facility-administered medications for this visit.       ROS: A 7-point review of systems is as per HPI, and is  otherwise negative.     PHYSICAL EXAM & DATA     Vital signs: Blood pressure 112/60, pulse 89, temperature 36.6 C (97.9 F), resp. rate 12, height 1.613 m (5' 3.5"), weight 53.524 kg (118 lb), SpO2 100.00%. on RA.  Body mass index is 20.57 kg/(m^2).   GEN: thin, comfortable, well-dressed  HEENT: oropharynx is clear. She very small normal flesh colored papules in her anterior nares that are about 3mm in diameter  CV: regular s1 and s2  Pulm: lungs are clear throughout with excellent air movement  Abd: not distended  Ext: there is no edema  Neuro: normal gait  Psych: very thorough, affect belies some anxiety      Data include the following:    Imaging: Personally reviewed.  CT scan 7/8 with a LUL spiculated  nodule that is unchanged since March and improved since 07/2012. There is a small RUL GG nodule that is stable since March.         ASSESSMENT & PLAN:     [name] is a 62 y.o. female with pulmonary nodules, depression, chronic fatigue, and likely a viral URI. Her mild infectious symptoms seem stable or improving, and we agreed to give them more time to see if they resolve. I have a mild suspicion that she may have some acid reflux, and we have decided to attempt a diagnostic trial of PPI to see if this reduces her cough or affects her food sensitivities. Regarding her lung nodules, the spiculated lesion is most likely scarring from the PNA or other process that led to her episode of hemoptysis. The small RUL grand-glass lesion is unlikely any significant pathology, but does warrant a total of 3 years of suveillance (unless it resolved before then) to insure it is not a less-aggressive malignancy. At this time, we will:    - Start omeprazole 20mg  daily.  - Schedule noncontrast CT chest for January 2015. If further imaging demonstrates no progression, she will complete surveillance in 10/2015.    Thank you for the opportunity to participate in Jackie Moran's care. We will schedule follow-up for 6 months. Please contact us if you have any other questions or concerns.     Sincerely,    Roselle Locus, MD  Fellow, Pulmonary and Critical Care Medicine    I evaluated the patient with Dr. Blenda Nicely. I agree with his findings and plan of care as documented above.  His note reflects my input.    Noralee Stain, MD

## 2013-04-29 ENCOUNTER — Encounter: Payer: Self-pay | Admitting: Primary Care

## 2013-04-29 ENCOUNTER — Ambulatory Visit: Payer: Self-pay | Admitting: Primary Care

## 2013-04-29 VITALS — BP 128/70 | HR 83 | Temp 99.6°F | Ht 63.5 in | Wt 120.2 lb

## 2013-04-29 DIAGNOSIS — R5383 Other fatigue: Secondary | ICD-10-CM

## 2013-04-29 DIAGNOSIS — R918 Other nonspecific abnormal finding of lung field: Secondary | ICD-10-CM

## 2013-04-29 MED ORDER — ZIPRASIDONE HCL 20 MG PO CAPS *I*
ORAL_CAPSULE | ORAL | Status: DC
Start: 2013-04-29 — End: 2013-09-23

## 2013-04-29 MED ORDER — AMPHETAMINE-DEXTROAMPHETAMINE 10 MG PO CP24 *I*
10.0000 mg | ORAL_CAPSULE | Freq: Two times a day (BID) | ORAL | Status: DC
Start: 2013-04-29 — End: 2013-07-02

## 2013-04-29 NOTE — Progress Notes (Signed)
Jackie Moran is a 62 y.o. female Followup for Fatigue, depression in remission and pulmonary nodules.  Most recent CT scan July 2014 showed stability of this 4 mm spiculated mass in the left lower lobe and also a groundglass nodule in the left upper lobe. She quit smoking about 30 years ago. She says she would like to forego any additional CT scanning to reduce exposure to radiation. We discussed at length criteria for monitor or nodules. I told her I had specific concerns about the spiculated nodule in strongly recommended she follow through with CT scan scheduled for February 2015. She agreed.    She's had no recurrent hemoptysis.    Depression remains in remission. Fatigue is also doing well. She is exercising daily walking her dog. Her weight is up slightly, about 3 pounds which is good. She has been under weight. She maintains a gluten-free diet and feels physically well.    Patient Active Problem List   Diagnosis Code   . Allergic Rhinitis 477.9   . 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 Gluten intolerance 579.0   . Fatigue 780.79   . Posterior vitreous detachment of left eye 379.21   . Hemoptysis 11/13 resolved 786.30   . Pulmonary nodules 793.19       ALLERGIES:  Egg; Gluten meal; Milk; Amoxicillin; Doxycycline hyclate; Penicillins; Environmental allergies; and No known latex allergy    Current Outpatient Prescriptions on File Prior to Visit   Medication Sig Dispense Refill   . estrogen conjugated-medroxyPROGESTERone (PREMPRO) 0.3-1.5 MG per tablet Take 1 tablet by mouth daily       . omeprazole (PRILOSEC) 20 MG capsule Take 1 capsule (20 mg total) by mouth daily (before breakfast)  30 capsule  5   . FLUoxetine (PROZAC) 20 MG capsule Take 3 capsules (60 mg total) by mouth daily  270 capsule  3   . Ascorbic Acid (VITAMIN C PO) Take by mouth       . Probiotic Product (PROBIOTIC DAILY PO) Take by mouth       . Calcium 500 MG tablet 2 times daily       . [DISCONTINUED]  nabumetone (RELAFEN) 500 MG tablet TAKE 1 TABLET TWICE DAILY. using prn  60  3   . Cholecalciferol (VITAMIN D) 1000 UNIT tablet TAKE 1 TABLET DAILY.  1  0   . Multiple Vitamin (MULTIVITAMIN) capsule TAKE 1 CAPSULE DAILY.    0     No current facility-administered medications on file prior to visit.     Medications reviewed, reconciled with patients and changes were made      Exam:  Filed Vitals:    04/29/13 1543   BP: 128/70   Pulse: 83   Temp: 37.6 C (99.6 F)   Height: 1.613 m (5' 3.5")   Weight: 54.522 kg (120 lb 3.2 oz)    Body mass index is 20.96 kg/(m^2).    SpO2 Readings from Last 3 Encounters:   04/29/13 99%   03/21/13 100%   01/27/13 99%       Appears well in no distress.  HEENT: No adenopathy in the anterior-posterior cervical chain.  No clavicular adenopathy.  Oropharynx clear  Neck veins flat.  No thyroid nodules.    Heart: RRR S1-S2 without murmurs  Lungs: Clear to auscultation          1. Pulmonary nodules    2. Fatigue      Stable pulmonary nodules. Strongly recommended  she follow through with CT scan. 2015. She agree.    Impression remission and fatigue also doing well. Renewed medication. Followup in mid February

## 2013-05-07 ENCOUNTER — Encounter: Payer: Self-pay | Admitting: Pulmonary Disease

## 2013-07-02 ENCOUNTER — Other Ambulatory Visit: Payer: Self-pay | Admitting: Primary Care

## 2013-07-02 MED ORDER — AMPHETAMINE-DEXTROAMPHETAMINE 10 MG PO CP24 *I*
10.0000 mg | ORAL_CAPSULE | Freq: Two times a day (BID) | ORAL | Status: DC
Start: 2013-07-02 — End: 2013-09-24

## 2013-07-02 MED ORDER — FLUOXETINE HCL 20 MG PO CAPS *I*
60.0000 mg | ORAL_CAPSULE | Freq: Every day | ORAL | Status: DC
Start: 2013-07-02 — End: 2013-10-13

## 2013-07-02 NOTE — Telephone Encounter (Signed)
Mail to patient.

## 2013-07-02 NOTE — Telephone Encounter (Signed)
Mailed home.

## 2013-07-09 ENCOUNTER — Ambulatory Visit: Payer: Self-pay | Admitting: Ophthalmology

## 2013-07-09 ENCOUNTER — Encounter: Payer: Self-pay | Admitting: Ophthalmology

## 2013-07-09 VITALS — Ht 63.0 in | Wt 120.0 lb

## 2013-07-09 DIAGNOSIS — H43819 Vitreous degeneration, unspecified eye: Secondary | ICD-10-CM

## 2013-07-10 NOTE — Progress Notes (Signed)
HPI    1 month f/u - PVD OD  Pt states she still has a large floater / "blob" OD that sometimes blocks   vision / still has flashes.  Some days worse than others.  Pt asks why OD seems hazier than OS ?        Base Eye Exam       Visual Acuity    Right Left   Dist cc 20/25 -2 20/20 -2   Method: Snellen - Linear   Correction: Glasses    Comments: Pt forgot usual glasses today / using Rx sunglasses to read eye chart   Tonometry    Right Left   Pressure 16 17   Method: Tonopen   Time: 4:06 PM   Pupils    Pupils   Right PERRLA   Left PERRLA   Visual Fields    Left Right   Result Full Full   Extraocular Movement    Right Left   Result Full, Ortho Full, Ortho   Neuro/Psych   Oriented x3: Yes   Mood/Affect: Normal   Dilation   Both eyes: 2.5% Phenylephrine, 1.0% Tropicamide @ 4:06 PM      Slit Lamp and Fundus Exam       External Exam    Right Left   External Normal ocular adnexae, lacrimal gland & drainage, orbits Normal ocular adnexae, lacrimal gland & drainage, orbits   Slit Lamp Exam    Right Left   Lids/Lashes Normal structure & position Normal structure & position   Conjunctiva/Sclera Normal bulbar/palpebral, conjunctiva, sclera Normal bulbar/palpebral, conjunctiva, sclera   Cornea Normal epithelium, stroma, endothelium, tear film Normal epithelium, stroma, endothelium, tear film   Anterior Chamber Clear & deep Clear & deep   Iris Normal shape, size, morphology Normal shape, size, morphology   Lens Trace Nuclear sclerosis Trace Nuclear sclerosis   Fundus Exam    Right Left   Vitreous PVD with Janann August Clear   Disc Normal size, appearance, nerve fiber layer Normal size, appearance, nerve fiber layer   Macula Normal Normal   Vessels Normal Normal   Periphery no breaks, all attached Normal          Posterior vitreous detachment  Stable and doing well.  Reviewed s/sx and pt aware to call if a problem  Otherwise 6 mo

## 2013-07-10 NOTE — Assessment & Plan Note (Signed)
Stable and doing well.  Reviewed s/sx and pt aware to call if a problem  Otherwise 6 mo
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
## 2013-07-10 NOTE — Assessment & Plan Note (Signed)
Stable, no breaks OU  Reviewed s/sx and pt aware to call if any problems  Otherwise pt will follow up annually with her home OD - here prn
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
## 2013-07-10 NOTE — Assessment & Plan Note (Signed)
No breaks  Reviewed s/sx and pt aware to call if a problem  F/u 1 mo

## 2013-09-08 ENCOUNTER — Telehealth: Payer: Self-pay

## 2013-09-08 NOTE — Telephone Encounter (Signed)
Jackie Moran is due for a chest CT early February.  I called [employer] [place] and they informed me that this insurance was terminated on 08/28/13.  I have left a message for Jackie Moran to call with her updated insurance information.

## 2013-09-22 ENCOUNTER — Telehealth: Payer: Self-pay

## 2013-09-22 NOTE — Telephone Encounter (Signed)
Patient is supposed to have CT in February. Patient's insurance termed on August 28, 2013. Patient has new insurance however she does not want to the CT at this time. The patient would like to postpone the CT for 1 year, states she feels wonderful.  The patient stated if Dr. Blenda NicelyGlickman does not need to see her at this time she is ok with postponing this visit as well. Please advise.

## 2013-09-22 NOTE — Telephone Encounter (Signed)
I called Jackie Moran to follow up on her new insurance so I may obtain prior authorization for her upcoming Chest CT.  She noted that she is feeling fine and does not want the scan for another year.  She will keep her appointment with Dr. Blenda NicelyGlickman in July.  She will call [facility] to cancel her CT appointment.

## 2013-09-23 ENCOUNTER — Other Ambulatory Visit: Payer: Self-pay | Admitting: Primary Care

## 2013-09-23 ENCOUNTER — Telehealth: Payer: Self-pay | Admitting: Primary Care

## 2013-09-23 MED ORDER — ZIPRASIDONE HCL 20 MG PO CAPS *I*
ORAL_CAPSULE | ORAL | Status: DC
Start: 2013-09-23 — End: 2014-10-02

## 2013-09-23 NOTE — Telephone Encounter (Addendum)
Creedence's new health insurance only covers NAME Brand Adderal XR. She has been using 10mg  in the summer and switching to 15mg  of Adderal in the winter. Can you write a new prescription for the Adderal XR 15mg , name brand and mail it to [place]. This also requires a Prior Authorization and the phone number that [place] [facility] has for the Prior Authorization is [phone] because she takes this medication twice daily. Let [facility] know when when the prescription has been mailed and when the Prior Authorization has been done. I-STOP done. I didn't set the medication up for renewal because of the change in strength.  [place] now has [employer]; plan # [id number] [phone] & Member ID # [id number] effective 08/28/13.

## 2013-09-23 NOTE — Telephone Encounter (Signed)
Fax to [facility] @ [phone].

## 2013-09-24 ENCOUNTER — Other Ambulatory Visit: Payer: Self-pay | Admitting: Primary Care

## 2013-09-24 MED ORDER — AMPHETAMINE-DEXTROAMPHETAMINE 15 MG PO CP24 *I*
15.0000 mg | ORAL_CAPSULE | Freq: Two times a day (BID) | ORAL | Status: DC
Start: 2013-09-24 — End: 2013-09-24

## 2013-09-24 MED ORDER — ADDERALL XR 15 MG PO CP24
15.0000 mg | ORAL_CAPSULE | Freq: Two times a day (BID) | ORAL | Status: DC
Start: 2013-09-24 — End: 2014-01-13

## 2013-09-24 NOTE — Telephone Encounter (Signed)
Faxed to [facility] pharmacy

## 2013-09-24 NOTE — Telephone Encounter (Signed)
Tell her rx will be mailed to her, but [facility]

## 2013-09-24 NOTE — Telephone Encounter (Signed)
Notified patient, and mailed home script.

## 2013-09-24 NOTE — Telephone Encounter (Signed)
Set up prior auth, tell the patient it will take time and most brand name requests are likely to be rejected

## 2013-10-03 ENCOUNTER — Telehealth: Payer: Self-pay | Admitting: Primary Care

## 2013-10-03 NOTE — Telephone Encounter (Signed)
For Monday-Patient has not yet heard back from you regarding prior authorization for her ADDERALL XR. Stated that it has been over a week, she wonders if you made any progress. Prior Auth was so she can take medication BID. Patient already has script, can not fill until she receives Auth. Please call-ok to leave VM.

## 2013-10-07 NOTE — Telephone Encounter (Signed)
Prior authorization approved for DAW twice daily doing Adderall XR for 1 year.

## 2013-10-13 ENCOUNTER — Encounter: Payer: Self-pay | Admitting: Primary Care

## 2013-10-13 ENCOUNTER — Ambulatory Visit: Payer: Self-pay | Admitting: Primary Care

## 2013-10-13 VITALS — BP 118/68 | HR 64 | Temp 97.6°F | Resp 18 | Ht 63.0 in | Wt 121.0 lb

## 2013-10-13 DIAGNOSIS — R918 Other nonspecific abnormal finding of lung field: Secondary | ICD-10-CM

## 2013-10-13 DIAGNOSIS — K9041 Non-celiac gluten sensitivity: Secondary | ICD-10-CM

## 2013-10-13 MED ORDER — FLUOXETINE HCL 20 MG PO CAPS *I*
60.0000 mg | ORAL_CAPSULE | Freq: Every day | ORAL | Status: DC
Start: 2013-10-13 — End: 2014-09-27

## 2013-10-13 NOTE — Progress Notes (Signed)
[id number] Jackie Moran is a 63 y.o. female Comes in for six-month followup depression in remission and anxiety.    She canceled her followup CT scan to assess a  spiculated 7x4 mm left upper lobe nodule and a second ground glass nodule. She is concerned about the dose of radiation exposure. I explained to her I felt this was not a wise move. I reviewed with her and gave her a handout on Fleischner society recommendations. Pulmonary nodules were discovered November 2013. She had a repeat CT a month later and then a followup CT scan April 2014. This places her CT scans only 5 months apart with the most recent scan being 10 months ago. Recommendations are initial followup at 6 and 12 months and then 18-24 months if no change. She has not met this criteria. Explained the purpose of these rules are 2 over overexposure to radiation with unnecessary repeat CT scans, and yet ensure close surveillance to detect a growing nodule in the lower resection for metastasis. After consideration she wishes to have her next scan November 2015. She understands my concern that this may not be the best choice.    Also discussed she is due for a Zostavax. She declined today.    Mood wise she says she feels well. 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.3    Gluten intolerance 579.0    Fatigue 780.79    Posterior vitreous detachment 379.21    Hemoptysis 11/13 resolved 786.30    Pulmonary nodules 793.19       ALLERGIES:  Egg; Gluten meal; Milk; Amoxicillin; Doxycycline hyclate; Penicillins; Environmental allergies; and No known latex allergy    Current Outpatient Prescriptions on File Prior to Visit   Medication Sig Dispense Refill    ADDERALL XR 15 MG 24 hr capsule Take 1 capsule (15 mg total) by mouth 2 times daily   Max daily dose: 30 mg Code B  180 capsule  0    ziprasidone  (GEODON) 20 MG capsule TAKE 1 CAPSULE DAILY  90 capsule  3    FLUoxetine (PROZAC) 20 MG capsule Take 3 capsules (60 mg total) by mouth daily  270 capsule  3    estrogen conjugated-medroxyPROGESTERone (PREMPRO) 0.3-1.5 MG per tablet Take 1 tablet by mouth daily        omeprazole (PRILOSEC) 20 MG capsule Take 1 capsule (20 mg total) by mouth daily (before breakfast)  30 capsule  5    Ascorbic Acid (VITAMIN C PO) Take by mouth        Probiotic Product (PROBIOTIC DAILY PO) Take by mouth        Calcium 500 MG tablet 2 times daily        [DISCONTINUED] nabumetone (RELAFEN) 500 MG tablet TAKE 1 TABLET TWICE DAILY. using prn  60  3    Cholecalciferol (VITAMIN D) 1000 UNIT tablet TAKE 1 TABLET DAILY.  1  0    Multiple Vitamin (MULTIVITAMIN) capsule TAKE 1 CAPSULE DAILY.    0     No current facility-administered medications on file prior to visit.     Medications reviewed, reconciled with patients and changes were made      Exam:  Filed Vitals:    10/13/13 0911   BP: 118/68   Pulse: 64   Temp:  36.4 C (97.6 F)   Resp: 18   Height: 1.6 m (_0 )   Weight: 54.885 kg (121 lb)    Body mass index is 21.44 kg/(m^2).    SpO2 Readings from Last 3 Encounters:   10/13/13 99%   04/29/13 99%   03/21/13 100%     Appears well in no distress.  HEENT: No adenopathy in the anterior-posterior cervical chain.  No clavicular adenopathy.  Oropharynx clear  Neck veins flat.  No thyroid nodules.    Heart: RRR S1-S2 without murmurs  Lungs: Clear to auscultation            1. Pulmonary nodules    2. Gluten intolerance        Pulmonary nodules. She wishes to delay next the CT scan until November 2015. This will be scheduled. She understands I feel this is not the best choice      Okay with gluten intolerance    Depression in remission and doing well.    Followup 6 months

## 2014-01-13 ENCOUNTER — Telehealth: Payer: Self-pay | Admitting: Primary Care

## 2014-01-13 ENCOUNTER — Other Ambulatory Visit: Payer: Self-pay | Admitting: Primary Care

## 2014-01-13 ENCOUNTER — Encounter: Payer: Self-pay | Admitting: Internal Medicine

## 2014-01-13 ENCOUNTER — Ambulatory Visit: Payer: Self-pay | Admitting: Internal Medicine

## 2014-01-13 VITALS — BP 118/62 | HR 69 | Temp 98.7°F | Ht 63.0 in | Wt 118.6 lb

## 2014-01-13 DIAGNOSIS — R059 Cough, unspecified: Secondary | ICD-10-CM

## 2014-01-13 DIAGNOSIS — J069 Acute upper respiratory infection, unspecified: Secondary | ICD-10-CM

## 2014-01-13 MED ORDER — SULFAMETHOXAZOLE-TRIMETHOPRIM 800-160 MG PO TABS *I*
1.0000 | ORAL_TABLET | Freq: Two times a day (BID) | ORAL | Status: DC
Start: 2014-01-13 — End: 2014-04-17

## 2014-01-13 MED ORDER — AMPHETAMINE-DEXTROAMPHETAMINE 10 MG PO CP24 *I*
10.0000 mg | ORAL_CAPSULE | Freq: Two times a day (BID) | ORAL | Status: AC
Start: 2014-01-13 — End: 2014-04-13

## 2014-01-13 NOTE — Telephone Encounter (Signed)
We should discuss

## 2014-01-13 NOTE — Patient Instructions (Addendum)
I see no indication of a bacterial respiritory illness on exam today,   We discussed having an antibiotic to take along on your trip in case you became more ill, however given the potential for increased arrhythmia risk and your allergies this is not easy to do safely.    Id recommend you use guaifenesin (generic for mucinex) this is an expectorant.  And dextromethorphan (generic for delsym) cough suppressant. (top care chest and cough)    If you feel more ill, develop pain, shortness of breath, fever seek medical evaluation.  Call for concerns.

## 2014-01-13 NOTE — Telephone Encounter (Signed)
Pt is returning  Mike's phone call.  It's regarding a medication and she says it's incorrect.  She is requesting a call back.

## 2014-01-13 NOTE — Progress Notes (Signed)
HPI:  Steward DroneBrenda presents with URI, states she's been feeling "lousy" for about two weeks but theres a lot of stress at work and that may be playing a role.   This past Friday (5 days ago) she went into work late as she had a hard time getting up and going, noted chills and then over the weekend developed a cough with "green phlegm" going out of town tomorrow for a wedding.     No HA, no sinus pain not especially full, ears and throat OK.  Temp measured at 99.5 at home recently. No pain with deep breath, not SOB no night sweats. No specific contacts that she is aware of.   She reports a couple of years ago she feels somewhat similar but then became very ill with pneumonia.     S: cough     Allergies   Allergen Reactions    Egg Hives     Pt notes allergy to eggs.  Rxn = hives.  Wishes to have strict allergy protocol.  SGuilbert, RD, [phone]    Gluten Meal Other (See Comments)     Pt thinks she has celiac disease-bloodwork was inconclusive.  She wishes to follow a strict gluten-free diet as she feels better eliminating gluten.  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 Rxn varies-sometimes causes diarrhea, sometimes causes constipation, and hives may occur. Wishes to have strict allergy protocol.  SGuilbert, RD, #[phone]    Amoxicillin     Doxycycline Hyclate       Nausea;     Penicillins     Environmental Allergies Other (See Comments)     Sneezing, post nasal drip    No Known Latex Allergy      Current Outpatient Prescriptions   Medication Sig    FLUoxetine (PROZAC) 20 MG capsule Take 3 capsules (60 mg total) by mouth daily    ADDERALL XR 15 MG 24 hr capsule Take 1 capsule (15 mg total) by mouth 2 times daily   Max daily dose: 30 mg Code B    ziprasidone (GEODON) 20 MG capsule TAKE 1 CAPSULE DAILY    estrogen conjugated-medroxyPROGESTERone (PREMPRO) 0.3-1.5 MG per tablet Take 1 tablet by mouth daily    Ascorbic Acid (VITAMIN C PO) Take  by mouth    Probiotic Product (PROBIOTIC DAILY PO) Take by mouth    Calcium 500 MG tablet 2 times daily    Cholecalciferol (VITAMIN D) 1000 UNIT tablet TAKE 1 TABLET DAILY.    Multiple Vitamin (MULTIVITAMIN) capsule TAKE 1 CAPSULE DAILY.    omeprazole (PRILOSEC) 20 MG capsule Take 1 capsule (20 mg total) by mouth daily (before breakfast)    [DISCONTINUED] nabumetone (RELAFEN) 500 MG tablet TAKE 1 TABLET TWICE DAILY. using prn       O:  General: Well-appearing, conversant female  HEENT: Normocephalic, atraumatic;  EOM intact, conjunctiva WNL;  Bilateral auditory canals WNL.  Bilateral TMs translucent;  No submandibular lymph adenopathy; oropharynx moist, neck supple.  Cardiovascular: RRR, S1-S2.   Pulmonary: Lungs clear to auscultation.  Musculoskeletal: Moving all extremities, ambulatory.  Skin: clean, dry and intact.  Psychiatric: Mood and affect WNL.     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  Appears well, no fever, clear lungs. She is traveling and was concerned this could develop into a more serious illness like pna.   She declined a CXR today, stating she wants no further radiation.  Allergies to PCN, doxycycline intolerance, on geodon (potential for QT prolongation with zpack or levofloxacin)     DDX includes: likely viral URI.     P: guaifenesin, dextromethorphan, rest and hydrate, advised if she becomes more ill she should e seek re-evaluation, call for questions

## 2014-01-13 NOTE — Addendum Note (Signed)
Addended by: Lowella Bandy'SHEA, Hosie Sharman A on: 01/13/2014 01:12 PM     Modules accepted: Orders, Medications

## 2014-01-13 NOTE — Telephone Encounter (Signed)
Nurse spoke with pt. Pt wanted to make sure RX went to mail order company, which it did.

## 2014-01-13 NOTE — Telephone Encounter (Signed)
Pt was in the office today and was seen by [name].  She states that [name] will not prescribe an antibiotic(Z-pak) she was on for years.  She requested that Dr. [name] give her a call to discuss.  Please advise.

## 2014-01-13 NOTE — Telephone Encounter (Signed)
I agreed with  KO and due to Geodon risk of quinolone and  azithro does not out way the risk.  She did have a serious pneumonia a few years ago and is worried to travel.  Will treat with Bactrim bid for a week

## 2014-02-17 ENCOUNTER — Other Ambulatory Visit: Payer: Self-pay | Admitting: Primary Care

## 2014-02-17 DIAGNOSIS — Z0000 Encounter for general adult medical examination without abnormal findings: Secondary | ICD-10-CM

## 2014-03-06 ENCOUNTER — Ambulatory Visit: Payer: Self-pay | Admitting: Pulmonary Disease

## 2014-03-09 ENCOUNTER — Ambulatory Visit: Payer: Self-pay | Admitting: Primary Care

## 2014-03-09 ENCOUNTER — Encounter: Payer: Self-pay | Admitting: Primary Care

## 2014-03-09 VITALS — BP 130/72 | HR 80 | Temp 98.5°F | Ht 63.0 in | Wt 119.0 lb

## 2014-03-09 DIAGNOSIS — R911 Solitary pulmonary nodule: Secondary | ICD-10-CM

## 2014-03-09 DIAGNOSIS — R042 Hemoptysis: Secondary | ICD-10-CM

## 2014-03-09 NOTE — Progress Notes (Signed)
AVW:UJWJXBHPI:Jackie Moran is a 63 y.o. female Comes in with an episode of hemoptysis. Saturday night she coughed up a clot of blood the size of her fingernail.    She had no discolored phlegm. She has no respiratory symptoms otherwise. No sinusitis. No cough or chest congestion. No fever.    She has on her CT scan dated April 2014 two spiculated lesions in her left upper lobe. She was advised to have CT scan every 6 months but she had declined. However now after this new episode of hemoptysis she is willing to have a CT scan.    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    Gluten intolerance 579.0    Fatigue 780.79    Posterior vitreous detachment 379.21    Hemoptysis 11/13 resolved 786.30    Pulmonary nodules 793.19       ALLERGIES:  Egg; Gluten meal; Milk; Amoxicillin; Doxycycline hyclate; Penicillins; Environmental allergies; and No known latex allergy    Current Outpatient Prescriptions on File Prior to Visit   Medication Sig Dispense Refill    amphetamine-dextroamphetamine (ADDERALL XR) 10 MG 24 hr capsule Take 1 capsule (10 mg total) by mouth 2 times daily   Max daily dose: 20 mg  180 capsule  0    sulfamethoxazole-trimethoprim (BACTRIM DS,SEPTRA DS) 800-160 MG per tablet Take 1 tablet by mouth 2 times daily  14 tablet  0    FLUoxetine (PROZAC) 20 MG capsule Take 3 capsules (60 mg total) by mouth daily  270 capsule  3    ziprasidone (GEODON) 20 MG capsule TAKE 1 CAPSULE DAILY  90 capsule  3    estrogen conjugated-medroxyPROGESTERone (PREMPRO) 0.3-1.5 MG per tablet Take 1 tablet by mouth daily        omeprazole (PRILOSEC) 20 MG capsule Take 1 capsule (20 mg total) by mouth daily (before breakfast)  30 capsule  5    Ascorbic Acid (VITAMIN C PO) Take by mouth        Probiotic Product (PROBIOTIC DAILY PO) Take by mouth        Calcium 500 MG tablet 2 times daily         [DISCONTINUED] nabumetone (RELAFEN) 500 MG tablet TAKE 1 TABLET TWICE DAILY. using prn  60  3    Cholecalciferol (VITAMIN D) 1000 UNIT tablet TAKE 1 TABLET DAILY.  1  0    Multiple Vitamin (MULTIVITAMIN) capsule TAKE 1 CAPSULE DAILY.    0     No current facility-administered medications on file prior to visit.     Medications reviewed, reconciled with patients and changes were made      Exam:  Filed Vitals:    03/09/14 1452   BP: 130/72   Pulse: 80   Temp: 36.9 C (98.5 F)   Height: 1.6 m (5\' 3" )   Weight: 53.978 kg (119 lb)    Body mass index is 21.09 kg/(m^2).    SpO2 Readings from Last 3 Encounters:   03/09/14 98%   01/13/14 96%   10/13/13 99%     Appears well in no distress.  HEENT: No adenopathy in the anterior-posterior cervical chain.  No clavicular adenopathy.  Oropharynx clear  Neck veins flat.  No thyroid nodules.    Heart: RRR S1-S2 without murmurs  Lungs: Clear to auscultation  1. Hemoptysis  CT chest without contrast   2. Lung nodule  CT chest without contrast     Episode of hemoptysis with 2 spiculated lesions in the left upper lobe. Will repeat CT scan of the chest. At this point no indication for antibiotics

## 2014-03-17 ENCOUNTER — Telehealth: Payer: Self-pay | Admitting: Primary Care

## 2014-03-17 NOTE — Telephone Encounter (Signed)
We finally got an auth on the 71250 CT chest w/o Berkley Harveyuth is [id number] expires 04/30/14   Please make appt let pt know please document the following   Place, Time, Does pt need labs , does pt need to be NPO also please put the name of the person you spoke to when making appt.   When you are done just close encounter .Marland Kitchen.   Thanks Selena BattenKim

## 2014-03-17 NOTE — Telephone Encounter (Signed)
Left a message on her cell phone informing her she's scheduled for CT at [facility] on  03/19/14 at 12:15, no prep or labs needed [facility]. I asked patient to call me back directly to confirm her appointment.

## 2014-03-17 NOTE — Telephone Encounter (Signed)
Patient called back and confirmed appointment

## 2014-03-20 NOTE — Progress Notes (Signed)
Quick Note:    Ms Jackie Moran had single episode of hemoptysis that worried her enough that she was willing to have a follow up CT scan. Previously she decline monitoring of the nodules. CT is unchanged over 15 month. No additional cough with blood aside from the single episode.    I advised her to consider 1year follow up  ______

## 2014-03-23 ENCOUNTER — Other Ambulatory Visit: Payer: Self-pay | Admitting: Orthopedic Surgery

## 2014-03-23 DIAGNOSIS — M79672 Pain in left foot: Secondary | ICD-10-CM

## 2014-03-24 ENCOUNTER — Encounter: Payer: Self-pay | Admitting: Orthopedic Surgery

## 2014-03-24 ENCOUNTER — Ambulatory Visit: Payer: Self-pay | Admitting: Orthopedic Surgery

## 2014-03-24 VITALS — BP 151/73 | HR 85 | Ht 63.5 in | Wt 117.0 lb

## 2014-03-24 DIAGNOSIS — R2242 Localized swelling, mass and lump, left lower limb: Secondary | ICD-10-CM

## 2014-03-24 DIAGNOSIS — M79672 Pain in left foot: Secondary | ICD-10-CM

## 2014-03-24 DIAGNOSIS — M19072 Primary osteoarthritis, left ankle and foot: Secondary | ICD-10-CM | POA: Insufficient documentation

## 2014-03-24 NOTE — Progress Notes (Signed)
Orthopaedic Visit:  Consult / New Problem  Jackie MorelBrenda J Moran, MRN: [id number]  Date of visit: 03/24/2014    CC: Left foot - great toe - swelling, pain   -referred by PCP     Subjective:     Date of visit: 03/24/2014  Symptoms duration:  Nodules mid great toe for a while - not sure how long - ? 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  -denies diabetes mellitus    Objective:    Alert and oriented x 3.  Pleasant, cooperative, appropriate.  Right and Left lower extremities: Palpable pulses, neurovascularly intact.    Calf soft and nontender.    Standing foot alignment: symmetrical - mild-mod pes planus    Side: LEFT foot  Swelling:  At great toe - mid dorsal and dorsal medial -   Tenderness:  At IP joint area  ROM:  ok  Strength:  ok  -3 nodules palpable at dosal mid great toe - one more superficial with cyst like appearance -     Imaging studies:   03-24-14:  Left foot xray  -initially declined then agreed  -reviewed MTP joint and IP joint jt space narrowing      Impression:  63 y.o. 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  Xray foot 03-24-14  MRI foot with contrast ordered [facility]      Detailed discussion and patient's questions answered.    I ordered the staff to fit the patient with:       Plan:     1. Immobilization:None  2. Weight bearing status:WB  3. DVT prophylaxis: na  4. Anti-inflammatory / Pain medication:  ICE 1-2 weeks   5. Orthotics:  Provided silipos toe sleeve to protect when using closed toe shoes - uses mostly sandals now  6. Therapy:    7. Exercise:    8. Bone Health: Vitamin D3:             Calcium:   9. Work Status: Patient working - Yes.    10. Follow - up:  1-2 weeks - at least 2-3 days after MRI to allow interpretation.  exam     For the next visit:  Follow-Up Needs / I am Ordering:  1. X-rays next visit:  NONE.  2. Room:  Clinic room  3. Cast:      Jenna LuoBenedict Alphonse Asbridge, MD  Professor of Orthopaedics  [facility] [employer] [facility]

## 2014-03-31 ENCOUNTER — Telehealth: Payer: Self-pay | Admitting: Orthopedic Surgery

## 2014-03-31 NOTE — Telephone Encounter (Signed)
[facility]'s MRI authorization went to peer to peer review, can you call [employer] at [phone] and use case # [id number] to help me get this approved?      Thank You !

## 2014-04-06 ENCOUNTER — Telehealth: Payer: Self-pay | Admitting: Orthopedic Surgery

## 2014-04-06 NOTE — Telephone Encounter (Signed)
Jackie Moran would like to speak to you, she wants a better understanding of what Dr Josie Dixon is looking for/ruling out by ordering an MRI.  Please call her at [phone].

## 2014-04-06 NOTE — Telephone Encounter (Signed)
Contacted  the patient per phone.  Her questions were answered to her satisfaction.  She will proceed with the MRI as ordered.

## 2014-04-07 ENCOUNTER — Ambulatory Visit
Admit: 2014-04-07 | Discharge: 2014-04-07 | Disposition: A | Discharge status: Home / Self Care | Payer: Self-pay | Source: Ambulatory Visit | Attending: Primary Care | Admitting: Primary Care

## 2014-04-07 ENCOUNTER — Telehealth: Payer: Self-pay | Admitting: Orthopedic Surgery

## 2014-04-07 DIAGNOSIS — Z0000 Encounter for general adult medical examination without abnormal findings: Secondary | ICD-10-CM

## 2014-04-07 DIAGNOSIS — R2242 Localized swelling, mass and lump, left lower limb: Secondary | ICD-10-CM

## 2014-04-07 LAB — URINALYSIS WITH REFLEX TO MICROSCOPIC
Blood,UA: NEGATIVE
Ketones, UA: NEGATIVE
Leuk Esterase,UA: NEGATIVE
Nitrite,UA: NEGATIVE
Protein,UA: 30 mg/dL — AB
Specific Gravity,UA: 1.026 (ref 1.002–1.030)
pH,UA: 5 (ref 5.0–8.0)

## 2014-04-07 LAB — CBC AND DIFFERENTIAL
Baso # K/uL: 0.1 10*3/uL (ref 0.0–0.1)
Basophil %: 1.3 % — ABNORMAL HIGH (ref 0.1–1.2)
Eos # K/uL: 0.1 10*3/uL (ref 0.0–0.4)
Eosinophil %: 2.4 % (ref 0.7–5.8)
Hematocrit: 42 % (ref 34–45)
Hemoglobin: 14 g/dL (ref 11.2–15.7)
Lymph # K/uL: 1.1 10*3/uL — ABNORMAL LOW (ref 1.2–3.7)
Lymphocyte %: 28.8 % (ref 19.3–51.7)
MCH: 30 pg/cell (ref 26–32)
MCHC: 33 g/dL (ref 32–36)
MCV: 91 fL (ref 79–95)
Mono # K/uL: 0.3 10*3/uL (ref 0.2–0.9)
Monocyte %: 9.1 % (ref 4.7–12.5)
Neut # K/uL: 2.2 10*3/uL (ref 1.6–6.1)
Platelets: 201 10*3/uL (ref 160–370)
RBC: 4.6 MIL/uL (ref 3.9–5.2)
RDW: 13.8 % (ref 11.7–14.4)
Seg Neut %: 58.4 % (ref 34.0–71.1)
WBC: 3.7 10*3/uL — ABNORMAL LOW (ref 4.0–10.0)

## 2014-04-07 LAB — COMPREHENSIVE METABOLIC PANEL
ALT: 19 U/L (ref 0–35)
AST: 26 U/L (ref 0–35)
Albumin: 4.3 g/dL (ref 3.5–5.2)
Alk Phos: 67 U/L (ref 35–105)
Anion Gap: 14 (ref 7–16)
Bilirubin,Total: 0.5 mg/dL (ref 0.0–1.2)
CO2: 26 mmol/L (ref 20–28)
Calcium: 8.8 mg/dL (ref 8.6–10.2)
Chloride: 104 mmol/L (ref 96–108)
Creatinine: 0.91 mg/dL (ref 0.51–0.95)
GFR,Black: 79 *
GFR,Caucasian: 68 *
Glucose: 81 mg/dL (ref 60–99)
Lab: 15 mg/dL (ref 6–20)
Potassium: 4.5 mmol/L (ref 3.3–5.1)
Sodium: 144 mmol/L (ref 133–145)
Total Protein: 6.3 g/dL (ref 6.3–7.7)

## 2014-04-07 LAB — LIPID PANEL
Chol/HDL Ratio: 1.7
Cholesterol: 191 mg/dL
HDL: 110 mg/dL
LDL Calculated: 72 mg/dL
Non HDL Cholesterol: 81 mg/dL
Triglycerides: 47 mg/dL

## 2014-04-07 LAB — HEMOGLOBIN A1C: Hemoglobin A1C: 5.1 % (ref 4.0–6.0)

## 2014-04-07 LAB — URINE MICROSCOPIC (IQ200)
RBC,UA: 9 /hpf — AB (ref 0–2)
WBC,UA: 2 /hpf (ref 0–5)

## 2014-04-07 LAB — TSH: TSH: 0.97 u[IU]/mL (ref 0.27–4.20)

## 2014-04-07 NOTE — Telephone Encounter (Signed)
MRI With contrast approval # [id number], Expires 05/15/2014

## 2014-04-07 NOTE — Telephone Encounter (Signed)
[facility] called because Danahi needs labs before her MRI with Contrast.  Can you put in orders for BUN, GFR and creatinine? Thanks

## 2014-04-07 NOTE — Telephone Encounter (Signed)
Labwork ordered as requested

## 2014-04-08 LAB — HEPATITIS C ANTIBODY: Hep C Ab: NEGATIVE

## 2014-04-10 LAB — VITAMIN D
25-OH VIT D2: <4 ng/mL
25-OH VIT D3: 40 ng/mL
25-OH Vit Total: 40 ng/mL (ref 30–60)

## 2014-04-17 ENCOUNTER — Encounter: Payer: Self-pay | Admitting: Primary Care

## 2014-04-17 ENCOUNTER — Ambulatory Visit: Payer: Self-pay | Admitting: Primary Care

## 2014-04-17 VITALS — BP 120/70 | HR 78 | Temp 97.9°F | Ht 63.5 in | Wt 119.0 lb

## 2014-04-17 DIAGNOSIS — R5383 Other fatigue: Secondary | ICD-10-CM

## 2014-04-17 DIAGNOSIS — R319 Hematuria, unspecified: Secondary | ICD-10-CM

## 2014-04-17 DIAGNOSIS — F3289 Other specified depressive episodes: Secondary | ICD-10-CM

## 2014-04-17 LAB — URINALYSIS WITH MICROSCOPIC
Blood,UA: NEGATIVE
Ketones, UA: NEGATIVE
Leuk Esterase,UA: NEGATIVE
Nitrite,UA: NEGATIVE
Protein,UA: NEGATIVE mg/dL
RBC,UA: <1 /hpf (ref 0–2)
Specific Gravity,UA: 1.004 (ref 1.002–1.030)
WBC,UA: <1 /hpf (ref 0–5)
pH,UA: 6 (ref 5.0–8.0)

## 2014-04-17 MED ORDER — NON-SYSTEM MEDICATION *A*
Status: DC
Start: 2014-04-17 — End: 2015-04-20

## 2014-04-17 NOTE — H&P (Signed)
 Jackie Moran is a 63 y.o. female Comes in for complete physical exam. She has depression that varies in intensity. She takes Geodon, Prozac and Adderall. She is chronically dysphoric.    She is under increased stress lately. 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 This makes her more anxious.    Physically she continues to feel well and she says remaining gluten-free has helped a lot.    She's had no recurrent episode of hemoptysis. She does have some concerning pulmonary nodules which are followed regularly on CT scan.    Her weight is stable. She walks her dog daily 20-30 minutes.    She declines the shingles vaccine.    Last mammogram 2013. Due for mammogram and DEXA scan. She will be due for colonoscopy in spring of 2016.    Currently not dating and is not sexually active        Problem List:  Patient Active Problem List   Diagnosis Code   . Allergic Rhinitis 477.9   . 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 Gluten intolerance 579.0   . Fatigue 780.79   . Posterior vitreous detachment 379.21   . Hemoptysis 11/13 resolved 786.30   . Pulmonary nodules 793.19   . 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 FLUoxetine (PROZAC) 20 MG capsule Take 3 capsules (60 mg total) by mouth daily 270 capsule 3   . estrogen conjugated-medroxyPROGESTERone (PREMPRO) 0.3-1.5 MG per tablet Take 1 tablet by mouth daily     . Ascorbic Acid (VITAMIN C PO) Take by mouth     . Calcium 500 MG tablet 2 times daily     . Cholecalciferol (VITAMIN D) 1000 UNIT tablet TAKE 1 TABLET DAILY. 1 0   . Multiple Vitamin (MULTIVITAMIN) capsule TAKE 1 CAPSULE DAILY.  0   . ziprasidone  (GEODON) 20 MG capsule TAKE 1 CAPSULE DAILY 90 capsule 3   . Probiotic Product (PROBIOTIC DAILY PO) Take by mouth       No current facility-administered medications on file prior to visit.     Medications reviewed, reconciled with patients and changes were made          Immunizations:  Immunization History   Administered Date(s) Administered   . Td 08/28/1986   . Tdap 12/06/2006         Social History:  History     Social History   . Marital Status: Divorced     Spouse Name: N/A     Number of Children: N/A   . Years of Education: N/A     Occupational History   . Not on file.     Social History Main Topics   . Smoking status: Former Smoker -- 1.00 packs/day for 10 years     Quit date: 11/15/1972   . Smokeless tobacco: Not on file   . Alcohol Use: No   . Drug Use: No   . Sexual Activity: Not Currently     Other Topics Concern   . Not on file     Social History Narrative  Family Hx      Father age 64 has asthma and has had an MI. Bikes and walks, myelodysplastic syndrome    Mother age 66 has died. She had a degenerative neurologic disease     Brother age 12 has hyperlipidemia,     Brother died age 70 leukemia            Personal Hx     Divorced for 25 years. She's had no children.     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                   Health Maintenance:  Health Maintenance   Topic Date Due   . BREAST CANCER SCREENING  06/19/2013   . DXA-OSTEOPENIA-EVERY 3 YEARS  12/12/2013   . COLON CANCER SCREENING 10 YEAR COLONOSCOPY  11/09/2014   . IMM-INFLUENZA (#1 of 1) 04/28/2014   . HIV TESTING OFFERED  Completed   . IMM-ZOSTER 60 YRS +  Addressed   . HEPATITIS C SCREENING OFFERED  Completed         EXB:MWUXLKG Health: No changes in appetite or weight. No fever, chills, or sweats. No fatigue or pain.   Skin: No sores or rashes. No changes in hair or nails.   Bleeding/immunity: No enlarged glands. No abnormal bleeding.   Endocrine: No heat or cold intolerance. No excessive thirst or  urination.   Musculoskeletal: No muscle weakness or pain. No joint swelling or pain.   EENT:  No loss of hearing or ringing in ears. No sore throat or hoarseness.   Head and neck: No headache, dizziness, or lightheadedness.   Heart and lung: No chest pain, SOB, palpitations. No wheezing, cough, or sputum. No leg swelling or pain with walking.   GI: No trouble swallowing or heartburn. No abdominal pain. No nausea or vomiting. No constipation or diarrhea.   Urinary: No increased frequency, burning, urgency, or incontinence. No blood in urine.   Neuropsych: No numbness or tingling. No loss of balance, or trouble walking. No seizures. No depression, anxiety, or suicidal thoughts. No poor judgment, memory, or attention.No fainting or loss of consciousness.       Physical Exam:  Filed Vitals:    04/17/14 1122   BP: 120/70   Pulse: 78   Temp: 36.6 C (97.9 F)   Height: 1.613 m (5' 3.5")   Weight: 53.978 kg (119 lb)     Body mass index is 20.75 kg/(m^2).   SpO2 Readings from Last 3 Encounters:   04/17/14 99%   03/09/14 98%   01/13/14 96%       GENERAL:  Alert and oriented; no acute distress  SKIN: Normal, no  lesions consistent with skin cancer or melanoma  HEAD:  normal  EYES:   --lids:  normal  --conjunctiva:  normal  --pupils:  normal  --sclera:  normal  --EOM:  normal  EARS, NOSE, THROAT:  --ext. ear:  normal  --T.M.'s:  normal  --nares:  normal  --teeth:  normal  --gums:  normal  --tonsils:  normal  --throat:  normal  NECK:  --ROM:  normal  --nodes:  negative  --carotids: normal, JVP flat  --bruits:  none heard   --thyroid:  normal, no nodules  HEART:  RRR, S1S2, no m/g/r  LUNGS:  clear bilaterally, no rales, rhonchi, crackles  CHEST:  normal  BREASTS:    --skin:  normal  --masses:  none  --nipples:  normal  --discharge:  none  --nodes:  none  BACK:  normal, no kyphosis or scoliosis  LYMPH: No cervical, clavicular, axilla, or inguinal adenopathy  ABDOMEN:    --softness:  normal  --tenderness:   non-tender  --distension:  none  --organs:  no HSM  --bruits:  none  --pulsatile mass:  none  RECTAL:  Not indicated  GENITALIA: deferred to GYN  EXTREMITIES:  --edema:  none  --pulses:  normal, dorsalis pedis posterior tibial pulses 2+ bilaterally  --joints:  normal  --nails:   normal    NEUROLOGIC:  Nonfocal exam, no abnormalities      Labs:  Recent Results (from the past 336 hour(s))   Lipid add Rfx to Drt LDL if Trig >400    Collection Time: 04/07/14  8:39 AM   Result Value Ref Range    Cholesterol 191 mg/dL    Triglycerides 47 mg/dL    HDL 161 mg/dL    LDL Calculated 72 mg/dL    Non HDL Cholesterol 81 mg/dL    Chol/HDL Ratio 1.7    Comprehensive metabolic panel    Collection Time: 04/07/14  8:39 AM   Result Value Ref Range    Sodium 144 133 - 145 mmol/L    Potassium 4.5 3.3 - 5.1 mmol/L    Chloride 104 96 - 108 mmol/L    CO2 26 20 - 28 mmol/L    Anion Gap 14 7 - 16    UN 15 6 - 20 mg/dL    Creatinine 0.96 0.45 - 0.95 mg/dL    GFR,Caucasian 68 *    GFR,Black 79 *    Glucose 81 60 - 99 mg/dL    Calcium 8.8 8.6 - 40.9 mg/dL    Total Protein 6.3 6.3 - 7.7 g/dL    Albumin 4.3 3.5 - 5.2 g/dL    Bilirubin,Total 0.5 0.0 - 1.2 mg/dL    AST 26 0 - 35 U/L    ALT 19 0 - 35 U/L    Alk Phos 67 35 - 105 U/L   Urinalysis with reflex to microscopic    Collection Time: 04/07/14  8:39 AM   Result Value Ref Range    Color, UA Yellow Yellow    Appearance,UR 1+ Cloudy (A) Clear    Specific Gravity,UA 1.026 1.002 - 1.030    Leuk Esterase,UA NEG NEGATIVE    Nitrite,UA NEG NEGATIVE    pH,UA 5.0 5.0 - 8.0    Protein,UA 30 (A) NEGATIVE mg/dL    Glucose,UA NORM mg/dL    Ketones, UA NEG NEGATIVE    Blood,UA NEG NEGATIVE   CBC and differential    Collection Time: 04/07/14  8:39 AM   Result Value Ref Range    WBC 3.7 (L) 4.0 - 10.0 THOU/uL    RBC 4.6 3.9 - 5.2 MIL/uL    Hemoglobin 14.0 11.2 - 15.7 g/dL    Hematocrit 42 34 - 45 %    MCV 91 79 - 95 fL    MCH 30 26 - 32 pg/cell    MCHC 33 32 - 36 g/dL    RDW 81.1 91.4 - 78.2 %    Platelets 201  160 - 370 THOU/uL    Seg Neut % 58.4 34.0 - 71.1 %    Lymphocyte % 28.8 19.3 - 51.7 %    Monocyte % 9.1 4.7 - 12.5 %    Eosinophil % 2.4 0.7 - 5.8 %    Basophil % 1.3 (H) 0.1 - 1.2 %    Neut #  K/uL 2.2 1.6 - 6.1 THOU/uL    Lymph # K/uL

## 2014-04-17 NOTE — Patient Instructions (Signed)
Due of Mammogram and bone density test    Colonoscopy due with Dr Lucienne MinksMadan March 2016.# [id number]

## 2014-04-24 ENCOUNTER — Ambulatory Visit: Payer: Self-pay | Admitting: Orthopedic Surgery

## 2014-04-24 ENCOUNTER — Encounter: Payer: Self-pay | Admitting: Orthopedic Surgery

## 2014-04-24 VITALS — BP 157/72 | HR 81 | Ht 63.0 in | Wt 119.6 lb

## 2014-04-24 DIAGNOSIS — M19072 Primary osteoarthritis, left ankle and foot: Secondary | ICD-10-CM

## 2014-04-24 DIAGNOSIS — M79672 Pain in left foot: Secondary | ICD-10-CM

## 2014-04-24 DIAGNOSIS — M67472 Ganglion, left ankle and foot: Secondary | ICD-10-CM | POA: Insufficient documentation

## 2014-04-24 NOTE — Progress Notes (Signed)
Orthopaedic Visit:  Consult / New Problem  Jackie Moran, MRN: [id number]  Date of visit: 04/24/2014    CC: Left foot - great toe - swelling, pain   -referred by PCP     Subjective:     03/24/14  Symptoms duration:  Nodules mid great toe for a while - not sure how long - ? 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   -Swelling of warmth of toe much better with ice and protection with silicone toe sleeve.  Reports Spontaneous partial decompression of largest distal nodule with jelly-like fluid that has since increased slightly in size again. She is here for follow up exam after an MRI was obtained.    Past medical History, Past surgical History, Medications, Allergies, ROS 12 point, Family history, Social history:  Encounter information and details reviewed, reviewed with patient.  -denies diabetes mellitus    Objective:    Alert and oriented x 3.  Pleasant, cooperative, appropriate.  Right and Left lower extremities: Palpable pulses, neurovascularly intact.    Calf soft and nontender.    Standing foot alignment: symmetrical - mild-mod pes planus    Side: LEFT foot  Swelling:  At great toe - mid dorsal and dorsal medial -   Tenderness:  At IP joint area  ROM:  ok  Strength:  ok  -3 nodules palpable at dosal mid great toe - one more superficial with cyst like appearance -     04/24/14  Left great toe with 3 palpable nodules. Largest one distal dorsal great toe with thinning and redish purple skin changes of the overlying skin. Cyst like appearance, 6mm diameter, decreased from previously  -much less diffuse swelling of toe  -mild warmth    Imaging studies:   03/24/14:  Left foot xray  -initially declined  then agreed  -reviewed MTP joint and IP joint jt space narrowing    04/24/14: MR Left foot  - multilobulated fluid collections over the dorsomedial great toe consistent with ganglion cyst  -no bone involvement, nonaggressive appearing      Impression:  63 y.o. 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  Xray foot 03-24-14  MRI foot with contrast ordered [facility]    04/24/14  -MR findings consistent with ganglion cyst - multiloculated, complex  -Great toe partially improved with symptomatic treatment  -Shoewear and pain still an issue  -Treatment options discussed including aspiration /decompression followed by ICE, coban compression dressing, boot for 1-2 weeks.  We agreed upon former.  Surgery with risks of wound healing/dehesciince - wound formation with large size of largest lesion, and very thin skin    Detailed discussion and patient's questions answered.    I ordered the staff to fit the patient with: high tide walking boot;  To use after aspiration  Plan:    1. Immobilization:None  2. Weight bearing status:WB  3. DVT prophylaxis: na  4. Anti-inflammatory / Pain medication:  ICE as needed  5. Orthotics: none  6. Therapy:  none  7. Exercise:  Limit exercise  8. Bone Health: Vitamin D3:  NA          Calcium: NA  9. Work Status: Patient working - Yes.    10. Follow - up:  Schedule an appointment for US guided aspiration of her ganglion cysts. Will plan to place her in a low tide walking boot after aspiration - fitted today.     For the next visit:  Follow-Up Needs / I am Ordering:  1. X-rays next visit:  none  2. Room:  Clinic room, [place] machine, and prepare for US exam and US guided aspiration foot/great toe lesion, then compression dressing, coban -  and use of boot  3. Cast: none    Catarina Hartshorn  Foot and Ankle Fellow  [place] [facility]    I saw and evaluated the patient.  Encounter information reviewed including past medical history, past surgical history, medications, allergies, review of systems, social history, and family history.  Images and laboratory results reviewed.  I agree with the resident's/fellow's findings and plan of care as documented above.  Note edited and finalized.  Discussed with patient in detail and questions answered.    Jenna Luo, MD  Professor of Orthopaedics  [employer] [facility] [employer] [facility] [employer]

## 2014-04-28 ENCOUNTER — Telehealth: Payer: Self-pay | Admitting: Orthopedic Surgery

## 2014-04-28 NOTE — Telephone Encounter (Signed)
Contacted patient per phone.  Answered the patient's questions to her satisfaction.  She will follow up as scheduled.

## 2014-04-28 NOTE — Telephone Encounter (Signed)
Belkis has some questions before scheduling an appointment for a US guided aspiration of her ganglion cysts.  Please call her at [phone].

## 2014-04-30 ENCOUNTER — Telehealth: Payer: Self-pay | Admitting: Primary Care

## 2014-04-30 MED ORDER — AMPHETAMINE-DEXTROAMPHETAMINE 15 MG PO CP24 *I*
15.0000 mg | ORAL_CAPSULE | Freq: Two times a day (BID) | ORAL | Status: DC
Start: 2014-04-30 — End: 2014-07-20

## 2014-04-30 NOTE — Telephone Encounter (Signed)
done

## 2014-04-30 NOTE — Telephone Encounter (Signed)
Jackie Moran is requesting Adderall XR , name brand because her insurance will only the name brand medication. I-STOP done.She takes it twice daily and she is also requesting a 90 day supply to her mail order company, [facility]. Let her know when this has been done. I didn't set this up for renewal because her medication list says .

## 2014-04-30 NOTE — Telephone Encounter (Signed)
Notified patient this am.

## 2014-05-05 ENCOUNTER — Encounter: Payer: Self-pay | Admitting: Orthopedic Surgery

## 2014-05-05 NOTE — Progress Notes (Signed)
Per [facility], authorization is not needed for a ultrasound guided aspiration     [phone]

## 2014-05-06 ENCOUNTER — Encounter: Payer: Self-pay | Admitting: Orthopedic Surgery

## 2014-05-06 ENCOUNTER — Ambulatory Visit: Payer: Self-pay | Admitting: Orthopedic Surgery

## 2014-05-06 VITALS — BP 159/69 | Ht 63.0 in | Wt 119.0 lb

## 2014-05-06 DIAGNOSIS — M19072 Primary osteoarthritis, left ankle and foot: Secondary | ICD-10-CM

## 2014-05-06 DIAGNOSIS — M7750 Other enthesopathy of unspecified foot: Secondary | ICD-10-CM

## 2014-05-06 DIAGNOSIS — M67472 Ganglion, left ankle and foot: Secondary | ICD-10-CM

## 2014-05-06 NOTE — Progress Notes (Signed)
Orthopaedic Visit  May 07, 2014    Patient returns for US exam foot, and US guided aspiration foot mass.    Risks and benefits discussed and she wishes to proceed.  Procedure pause performed, site marked.    Procedure :  Diagnostic ultrasound left FOOT - COMPLETE    Examiner :  Jenna Luo, MD    Equipment : [employer] M[employer]Turbo, [employer]    CPT Code : 16109    ICD- 9 Code : 719.47;  727.06; Foot mass    Technique: With the patient in the  supine position multiple static and dynamic images were obtained of the left FOOT using the linear transducer  Appropriate images were saved and archived.     Findings :     Heel:  Long axis and axial views of the Plantar Heel and foot demonstrates normal of the plantar fascia.    Long axis view of the Posterior Heel demonstrates normal of the Achilles tendon at the insertion area and normal of the retrocalcaneal bursae.    Long axis and axial view of the Tarsal Tunnel demonstratesnormal of the tunnel.       Joints:  Subtalar joint evaluation demonstrates normal joint fluid and normal periarticular bone anatomy.    1st MTP joint evaluation demonstrates normal joint fluid and normal periarticular bone anatomy.      Tendons:               Long axis view of the anterior FOOT demonstrates normal of the anterior tibial tendon.    Long axis view of the anterior FOOT demonstrates excess fluid along Extensor Hallucis longus tendon consistent with tendinitis, no tear    Long axis and axial view of great toe dorsally reveals hypoechoic lesions, multiple - 3 consistent with fluid filled soft tissue mass      Impression: Diagnostic ultrasound of the left FOOT with findings supporting Extensor hallucis longus tendinitis; and fluid filled multiloculated soft tissue mass great toe dorsal.            Procedure :  Ultrasound guided Aspiration and decompression  injection of the left foot/great toe mass    Examiner :  Jenna Luo, MD    Equipment : [employer] M-Turbo, [employer]    CPT Code : 60454 - aspiration - US guided    ICD- 9 Code : 719.47 and 715.17      Technique: After pre-procedure checks the patient was appropriately positioned. Chlorhexadine scrub and prep of the injection site were done. The linear transducer was sterilized with chorhexadine and sterile transducer gel was ultilized. Ultrasound imagery of the injection site was obtained with the [employer] M-Turbo - see separate note - and appropriated images were labeled, saved and permanently archived.        Findings : With the patient in the supine position the linear transducer was utilized to visualize the left foot via a medial and anterior approach and under continuous ultrasound guidance a 18 gauge 1.5 inch needle was inserted into the soft tissue mass.  Proper needle tip placement verification was obtained with the ultrasound guidance aspiration and needling and decompression perfomred - about 1 cc gelatinous material obtained.      Impression :   Ultrasound guided aspiration, needling and decompression injection of the left - foot for soft tissue mass/cyst..        PLAN:  -Walking boot - use outside house for 1 week  -ICE twice a day x 5 days, nsaids few days  otc  -Twice daily dressing change, supersponge and coban compressing dressing, and manual decompression for next 5 days, then prn    F/U:  3-4 weeks for exam and assess progress.  Earlier with NP if problems, or concerns of infection.    Halina Andreas, MD  Professor of Orthopaedics  [facility]

## 2014-06-01 ENCOUNTER — Ambulatory Visit: Payer: Self-pay | Admitting: Orthopedic Surgery

## 2014-06-01 ENCOUNTER — Encounter: Payer: Self-pay | Admitting: Orthopedic Surgery

## 2014-06-01 VITALS — BP 148/68 | Ht 63.5 in | Wt 123.0 lb

## 2014-06-01 DIAGNOSIS — M7750 Other enthesopathy of unspecified foot: Secondary | ICD-10-CM

## 2014-06-01 DIAGNOSIS — M67472 Ganglion, left ankle and foot: Secondary | ICD-10-CM

## 2014-06-01 DIAGNOSIS — M19072 Primary osteoarthritis, left ankle and foot: Secondary | ICD-10-CM

## 2014-06-01 NOTE — Progress Notes (Signed)
Orthopaedic Visit:   Jackie Moran, MRN: [id number]  Date of visit: 06/01/2014    CC: Left foot - great toe - swelling, pain   -referred by PCP     Subjective:   06-01-14:  -better, now more localized swelling 1 area near IP joint  -pleased  -just starting to use closed toe shoes  -tried boot after injection - too heavy, aggravated hips and knees, did not like    05-06-14 : US exam and US guided aspiration foot - ganglion cysts    03/24/14  Symptoms duration:  Nodules mid great toe for a while - not sure how long - ? 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   -Swelling of warmth of toe much better with ice and protection with silicone toe sleeve.  Reports Spontaneous partial decompression of largest distal nodule with jelly-like fluid that has since increased slightly in size again. She is here for follow up exam after an MRI was obtained.    Past medical History, Past surgical History, Medications, Allergies, ROS 12 point, Family history, Social history:  Encounter information and details reviewed, reviewed with patient.  -denies diabetes mellitus    Objective:    Alert and oriented x 3.  Pleasant, cooperative, appropriate.  Right and Left lower extremities: Palpable pulses, neurovascularly intact.    Calf soft and nontender.    Standing foot alignment: symmetrical - mild-mod pes planus    Side: LEFT foot  Swelling:  At great toe - mid dorsal and dorsal medial -   Tenderness:  At IP joint area  ROM:  ok  Strength:  ok  -3 nodules palpable at dosal mid great toe - one more superficial with cyst like appearance -     04/24/14  Left great toe with 3 palpable nodules. Largest one distal dorsal great toe  with thinning and redish purple skin changes of the overlying skin. Cyst like appearance, 6mm diameter, decreased from previously  -much less diffuse swelling of toe  -mild warmth    06-01-14:  -TODAY, much less swelling  -localiized to 1 nodule at IP joint area, just proximal  -mild swelling toe  -minimal warmth    Imaging studies:   03/24/14:  Left foot xray  -initially declined then agreed  -reviewed MTP joint and IP joint jt space narrowing    04/24/14: MR Left foot  - multilobulated fluid collections over the dorsomedial great toe consistent with ganglion cyst  -no bone involvement, nonaggressive appearing      Impression:  63 y.o. 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  Xray foot 03-24-14  MRI foot with contrast ordered [facility]    04/24/14  -MR findings consistent with ganglion cyst - multiloculated, complex  -Great toe partially improved with symptomatic treatment  -Shoewear and pain  still an issue  -Treatment options discussed including aspiration /decompression followed by ICE, coban compression dressing, boot for 1-2 weeks.  We agreed upon former.  Surgery with risks of wound healing/dehesciince - wound formation with large size of largest lesion, and very thin skin    06-01-14-  Much improved 3 weeks post aspiration  -more localized swelling, much less tender  -foot/toe soft tissues no longer markedly swollen with tense ski  -discussed if starts to recur, suggest sooner than later surgical excision, debride joint.    Detailed discussion and patient's questions answered.    I ordered the staff to fit the patient with:       Plan:    1. Immobilization:None  2. Weight bearing status:WB  3. DVT prophylaxis: na  4. Anti-inflammatory / Pain medication:   ICE as needed  5. Orthotics: suggest silicone toe sleeve as weans into closed toed shoes with colder weather  6. Therapy:  none  7. Exercise:  As tolerated  8. Bone Health: Vitamin D3:  NA          Calcium: NA  9. Work Status: Patient working - Yes.    10. Follow - up;  3-4 weeks to check swelling - if increasing, surgery recommended earlier than later (wound healing issues lessen)     For the next visit:  Follow-Up Needs / I am Ordering:  1. X-rays next visit:  none  2. Room:  Clinic   3. Cast: none      Jenna LuoBenedict Kiyan Burmester, MD  Professor of Orthopaedics  [address],[facility] [employer] [facility]

## 2014-06-22 ENCOUNTER — Ambulatory Visit: Payer: Self-pay | Admitting: Orthopedic Surgery

## 2014-07-20 ENCOUNTER — Telehealth: Payer: Self-pay | Admitting: Primary Care

## 2014-07-20 MED ORDER — AMPHETAMINE-DEXTROAMPHETAMINE 10 MG PO CP24 *I*
10.0000 mg | ORAL_CAPSULE | Freq: Two times a day (BID) | ORAL | Status: DC
Start: 2014-07-20 — End: 2014-11-12

## 2014-07-20 NOTE — Telephone Encounter (Signed)
Done, sent to [facility]

## 2014-07-20 NOTE — Telephone Encounter (Signed)
For Tuesday--Pt would like her Adderall Rx to be decreased to 10mg  twice daily with a 90 day supply written. Please send to pharmacy on file. istop done.

## 2014-07-21 NOTE — Telephone Encounter (Signed)
Notified patient.

## 2014-08-17 ENCOUNTER — Telehealth: Payer: Self-pay | Admitting: Pulmonary Disease

## 2014-08-17 NOTE — Telephone Encounter (Signed)
Patient called to cancel 09/04/14 FUV w/ Dr Blenda NicelyGlickman.  Appt canceled in ID[facility].  Patient chose not to reschedule at this time, and offered no reason for cancellation.

## 2014-09-04 ENCOUNTER — Ambulatory Visit: Payer: Self-pay | Admitting: Pulmonary Disease

## 2014-09-27 ENCOUNTER — Other Ambulatory Visit: Payer: Self-pay | Admitting: Primary Care

## 2014-10-02 ENCOUNTER — Other Ambulatory Visit: Payer: Self-pay | Admitting: Primary Care

## 2014-11-12 ENCOUNTER — Other Ambulatory Visit: Payer: Self-pay | Admitting: Primary Care

## 2014-11-12 MED ORDER — AMPHETAMINE-DEXTROAMPHETAMINE 10 MG PO CP24 *I*
10.0000 mg | ORAL_CAPSULE | Freq: Two times a day (BID) | ORAL | Status: DC
Start: 2014-11-12 — End: 2014-11-19

## 2014-11-19 ENCOUNTER — Telehealth: Payer: Self-pay | Admitting: Primary Care

## 2014-11-19 MED ORDER — AMPHETAMINE-DEXTROAMPHETAMINE 20 MG PO CP24 *I*
20.0000 mg | ORAL_CAPSULE | Freq: Every morning | ORAL | Status: DC
Start: 2014-11-19 — End: 2015-03-17

## 2014-11-19 NOTE — Telephone Encounter (Signed)
Notified patient.

## 2014-11-19 NOTE — Telephone Encounter (Signed)
done

## 2014-11-19 NOTE — Telephone Encounter (Signed)
Pt called, states her Insurance will no longer pay for two tablets a day.  She is requesting a new RX for one tablet a day, 90 days supply.  Please advise and send RX to pharmacy on file.  ISTOP DONE    amphetamine-dextroamphetamine (ADDERALL XR) 10 MG 24 hr capsule 180 capsule 0 11/12/2014       Sig - Route: Take 1 capsule (10 mg total) by mouth 2 times daily  Max daily dose: 20 mg Code B - Oral     E-Prescribing Status: Receipt confirmed by pharmacy (11/12/2014 3:39 PM EDT)

## 2014-11-30 ENCOUNTER — Telehealth: Payer: Self-pay | Admitting: Primary Care

## 2014-11-30 NOTE — Telephone Encounter (Signed)
Notified patient, but she says she will pass on it, but thank you..Marland Kitchen

## 2014-11-30 NOTE — Telephone Encounter (Signed)
If she is referring to [place] (lots of TV ads) it does work, but must be applied daily for 52 weeks.  Insurance does not cover it, cost is about $60 per month. I can order it if she wishes.

## 2014-11-30 NOTE — Telephone Encounter (Signed)
Pt states there is a new topical treatment, she cannot recall the name for Toe Fungus and would like to try it.  Does pt need an office visit first?  or she states please send RX to pharmacy on file for a 30 day supply. Please advise

## 2015-02-02 ENCOUNTER — Telehealth: Payer: Self-pay | Admitting: Primary Care

## 2015-02-02 ENCOUNTER — Other Ambulatory Visit: Payer: Self-pay | Admitting: Primary Care

## 2015-02-02 DIAGNOSIS — R911 Solitary pulmonary nodule: Secondary | ICD-10-CM

## 2015-02-02 NOTE — Telephone Encounter (Signed)
Left a message on her cell phone to call back. Need to arrange [facility], which doesn't required any prior approval per her [employer] insurance. ID # [id number]. If she would like to go ahead and schedule she can contact [facility] # [id number], this doesn't need to be until July.

## 2015-02-03 NOTE — Telephone Encounter (Signed)
Left a 2nd message to return my call.  °

## 2015-02-08 NOTE — Telephone Encounter (Signed)
Noted  

## 2015-02-08 NOTE — Telephone Encounter (Signed)
Steward Drone returned Jackie Moran's call. She was given the message noted below. Nilla said to thank Dr Langston Masker for the reminder, but she is doing fine and she isn't interested in having a follow up chest CT.

## 2015-02-15 ENCOUNTER — Ambulatory Visit
Admit: 2015-02-15 | Discharge: 2015-02-15 | Disposition: A | Discharge status: Home / Self Care | Payer: Self-pay | Source: Ambulatory Visit | Attending: Obstetrics & Gynecology | Admitting: Obstetrics & Gynecology

## 2015-02-16 LAB — VAGINITIS SCREEN: DNA PROBE: Vaginitis Screen:DNA Probe: 0

## 2015-03-10 ENCOUNTER — Other Ambulatory Visit: Payer: Self-pay | Admitting: Primary Care

## 2015-03-10 DIAGNOSIS — Z0000 Encounter for general adult medical examination without abnormal findings: Secondary | ICD-10-CM

## 2015-03-17 ENCOUNTER — Other Ambulatory Visit: Payer: Self-pay | Admitting: Primary Care

## 2015-03-17 MED ORDER — AMPHETAMINE-DEXTROAMPHETAMINE 20 MG PO CP24 *I*
20.0000 mg | ORAL_CAPSULE | Freq: Every morning | ORAL | 0 refills | Status: DC
Start: 2015-03-17 — End: 2015-06-08

## 2015-03-17 NOTE — Telephone Encounter (Signed)
I-STOP done. Call patient when Rx has been approved.

## 2015-03-18 ENCOUNTER — Telehealth: Payer: Self-pay | Admitting: Ophthalmology

## 2015-03-18 NOTE — Telephone Encounter (Signed)
The patient is scheduled to see Dr. Dory Peru Friday, 03/19/15 at 9:30am

## 2015-03-18 NOTE — Telephone Encounter (Signed)
Ophthalmology - Triage Form       Version 1.0    Date: 03/18/2015    Time: 4:39 PM   Operator: Birdie Riddle     Symptoms:     OD  (Right) OS   (Left) OU   (Both)   Which Eye:  x    When did the problem begin:  03/17/15    Pain, if yes (0=no pain, 10=worst pain  no    Eye Surgery:                   Claudine Mouton for positive symptoms       Eye or head trauma      Intermittent visual loss      Sudden visual loss      Blurry Vision   x   Flashes   x   Floaters      Spots      Light Sensitivity      Redness      Itchy      Discharge      Swelling        Patient Requests to speak with provider directly     Additional Information:  Patient is experiencing lightning flashes and "Globbie" floaters in OS. Patient would like to be seen if possible please give patient a call at [phone]

## 2015-03-19 ENCOUNTER — Encounter: Payer: Self-pay | Admitting: Ophthalmology

## 2015-03-19 ENCOUNTER — Ambulatory Visit: Payer: Self-pay | Admitting: Ophthalmology

## 2015-03-19 DIAGNOSIS — H43812 Vitreous degeneration, left eye: Secondary | ICD-10-CM | POA: Insufficient documentation

## 2015-03-19 DIAGNOSIS — H43811 Vitreous degeneration, right eye: Secondary | ICD-10-CM

## 2015-03-19 NOTE — Assessment & Plan Note (Signed)
In 2013. Now stable, floaters have resolved, asx

## 2015-03-19 NOTE — Assessment & Plan Note (Signed)
New PVD OS with Weiss ring, no cells, no tears  Doubt it was related to the trampoline  Reviewed s/sx and pt aware to call if any problems  Otherwise 1 mo

## 2015-03-19 NOTE — Progress Notes (Signed)
HPI     Urgent visit - flashes and floaters OS  Hx - PVD OD - last seen 2014  Pt reported on Wednesday evening, saw a flash in OS. 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 Has had several episodes of   flashes yesterday and this morning also, notices mostly in lighting that   is dim. OS feels achy. OD doing well.  Was jumping on the trampoline   before it happened.  No trauma       Last edited by Shawnie Pons, MD on 03/19/2015 10:11 AM.     Roni Bread Eye Exam     Visual Acuity (Snellen - Linear)      Right Left   Dist cc 20/20 20/20 -1       Correction:  Glasses      Tonometry (Tonopen, 9:41 AM)      Right Left   Pressure 23 21         Pupils      Pupils APD   Right PERRLA None   Left PERRLA None         Visual Fields      Left Right   Result Full Full         Extraocular Movement      Right Left   Result Full, Ortho Full, Ortho         Neuro/Psych     Oriented x3:  Yes    Mood/Affect:  Normal      Dilation     Both eyes:  2.5% Phenylephrine, 1.0% Tropicamide @ 9:41 AM            Slit Lamp and Fundus Exam     External Exam      Right Left    External Normal ocular adnexae, lacrimal gland & drainage, orbits Normal ocular adnexae, lacrimal gland & drainage, orbits      Slit Lamp Exam      Right Left    Lids/Lashes Normal structure & position Normal structure & position    Conjunctiva/Sclera Normal bulbar/palpebral, conjunctiva, sclera Normal bulbar/palpebral, conjunctiva, sclera    Cornea Normal epithelium, stroma, endothelium, tear film Normal epithelium, stroma, endothelium, tear film    Anterior Chamber Clear & deep Clear & deep    Iris Normal shape, size, morphology Normal shape, size, morphology    Lens Normal cortex, nucleus, anterior/posterior capsule, clarity Normal cortex, nucleus, anterior/posterior capsule, clarity      Fundus Exam      Right Left    Vitreous PVD with floaters PVD with WEiss, floaters    Disc Normal size, appearance, nerve fiber layer Normal size, appearance, nerve fiber  layer    Macula Normal Normal    Vessels Normal Normal    Periphery all attached no tears no retinal tears - depressed 360                Posterior vitreous detachment  In 2013. Now stable, floaters have resolved, asx    PVD (posterior vitreous detachment), left  New PVD OS with Weiss ring, no cells, no tears  Doubt it was related to the trampoline  Reviewed s/sx and pt aware to call if any problems  Otherwise 1 mo

## 2015-04-09 ENCOUNTER — Ambulatory Visit
Admit: 2015-04-09 | Discharge: 2015-04-09 | Disposition: A | Discharge status: Home / Self Care | Payer: Self-pay | Source: Ambulatory Visit | Attending: Primary Care | Admitting: Primary Care

## 2015-04-09 DIAGNOSIS — Z0000 Encounter for general adult medical examination without abnormal findings: Secondary | ICD-10-CM

## 2015-04-09 LAB — URINALYSIS WITH REFLEX TO MICROSCOPIC
Blood,UA: NEGATIVE
Ketones, UA: NEGATIVE
Leuk Esterase,UA: NEGATIVE
Nitrite,UA: NEGATIVE
Protein,UA: NEGATIVE mg/dL
Specific Gravity,UA: 1.014 (ref 1.002–1.030)
pH,UA: 7 (ref 5.0–8.0)

## 2015-04-09 LAB — HEMOGLOBIN A1C: Hemoglobin A1C: 5.2 % (ref 4.0–6.0)

## 2015-04-09 LAB — COMPREHENSIVE METABOLIC PANEL
ALT: 20 U/L (ref 0–35)
AST: 24 U/L (ref 0–35)
Albumin: 4.3 g/dL (ref 3.5–5.2)
Alk Phos: 68 U/L (ref 35–105)
Anion Gap: 11 (ref 7–16)
Bilirubin,Total: 0.5 mg/dL (ref 0.0–1.2)
CO2: 28 mmol/L (ref 20–28)
Calcium: 9 mg/dL (ref 8.6–10.2)
Chloride: 101 mmol/L (ref 96–108)
Creatinine: 0.92 mg/dL (ref 0.51–0.95)
GFR,Black: 77 *
GFR,Caucasian: 67 *
Glucose: 84 mg/dL (ref 60–99)
Lab: 14 mg/dL (ref 6–20)
Potassium: 4.5 mmol/L (ref 3.3–5.1)
Sodium: 140 mmol/L (ref 133–145)
Total Protein: 6.2 g/dL — ABNORMAL LOW (ref 6.3–7.7)

## 2015-04-09 LAB — CBC AND DIFFERENTIAL
Baso # K/uL: 0.1 10*3/uL (ref 0.0–0.1)
Basophil %: 1.6 %
Eos # K/uL: 0.3 10*3/uL (ref 0.0–0.4)
Eosinophil %: 7.6 %
Hematocrit: 42 % (ref 34–45)
Hemoglobin: 14 g/dL (ref 11.2–15.7)
IMM Granulocytes #: 0 10*3/uL (ref 0.0–0.1)
IMM Granulocytes: 0.7 %
Lymph # K/uL: 0.8 10*3/uL — ABNORMAL LOW (ref 1.2–3.7)
Lymphocyte %: 18.7 %
MCH: 31 pg/cell (ref 26–32)
MCHC: 34 g/dL (ref 32–36)
MCV: 93 fL (ref 79–95)
Mono # K/uL: 0.4 10*3/uL (ref 0.2–0.9)
Monocyte %: 9.1 %
Neut # K/uL: 2.8 10*3/uL (ref 1.6–6.1)
Nucl RBC # K/uL: 0 10*3/uL (ref 0.0–0.0)
Nucl RBC %: 0 /100 WBC (ref 0.0–0.2)
Platelets: 216 10*3/uL (ref 160–370)
RBC: 4.5 MIL/uL (ref 3.9–5.2)
RDW: 13.3 % (ref 11.7–14.4)
Seg Neut %: 62.3 %
WBC: 4.5 10*3/uL (ref 4.0–10.0)

## 2015-04-09 LAB — TSH: TSH: 1 u[IU]/mL (ref 0.27–4.20)

## 2015-04-09 LAB — LIPID PANEL
Chol/HDL Ratio: 1.8
Cholesterol: 184 mg/dL
HDL: 103 mg/dL
LDL Calculated: 69 mg/dL
Non HDL Cholesterol: 81 mg/dL
Triglycerides: 62 mg/dL

## 2015-04-09 LAB — VITAMIN B12: Vitamin B12: 695 pg/mL (ref 211–946)

## 2015-04-20 ENCOUNTER — Ambulatory Visit: Payer: Self-pay | Admitting: Primary Care

## 2015-04-20 ENCOUNTER — Encounter: Payer: Self-pay | Admitting: Primary Care

## 2015-04-20 VITALS — BP 118/68 | HR 64 | Temp 99.2°F | Ht 63.5 in | Wt 117.2 lb

## 2015-04-20 DIAGNOSIS — F32A Depression, unspecified: Secondary | ICD-10-CM

## 2015-04-20 DIAGNOSIS — R918 Other nonspecific abnormal finding of lung field: Secondary | ICD-10-CM

## 2015-04-20 DIAGNOSIS — Z0000 Encounter for general adult medical examination without abnormal findings: Secondary | ICD-10-CM

## 2015-04-20 NOTE — H&P (Addendum)
Jackie Moran is a 64 y.o. female Comes in for complete physical exam. She is feeling the best she has felt in years. She was laid off from a very stressful job this past April. Currently receiving unemployment. The lack of stress in her life has changed her mood. She feels much less depressed and wants to go off Geodon. Will continue on fluoxetine 60 mg.    She is less fatigued. She is exercising 3 times per [place] recreation [place]. She is a new relationship with a man since May, he is an old childhood friend.    She is due for colonoscopy March 2016. She is overdue for a DEXA scan and mammogram by 3 years. Continues to decline shingles vaccine.    She has some ground glass pulmonary nodules discovered after episode of hemoptysis July 2014. She refuses followup CT scans. She is concerned with excess exposure to radiation. 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    Ganglion cyst of left foot M67.472    Left foot pain M79.672    Tendinitis of foot M77.50    PVD (posterior vitreous detachment), left H43.812         Allergies:  Egg; Gluten meal; Milk; Amoxicillin; Doxycycline hyclate; Penicillins; Environmental allergies; and No known latex allergy       Medications:  Current Outpatient Prescriptions on File Prior to Visit   Medication Sig Dispense Refill    amphetamine-dextroamphetamine (ADDERALL XR) 20 MG 24 hr capsule Take 1 capsule (20 mg total) by mouth every morning   Max daily dose: 20 mg Code B 90 capsule 0    FLUoxetine (PROZAC) 20 MG capsule Take 3 capsules by mouth  daily 270 capsule 3    estrogen conjugated-medroxyPROGESTERone (PREMPRO) 0.3-1.5 MG per tablet  Take 1 tablet by mouth daily      Ascorbic Acid (VITAMIN C PO) Take by mouth      Probiotic Product (PROBIOTIC DAILY PO) Take by mouth      Calcium 500 MG tablet 2 times daily      Cholecalciferol (VITAMIN D) 1000 UNIT tablet TAKE 1 TABLET DAILY. 1 0    Multiple Vitamin (MULTIVITAMIN) capsule TAKE 1 CAPSULE DAILY.  0    ziprasidone (GEODON) 20 MG capsule Take 1 capsule by mouth  daily 90 capsule 3     No current facility-administered medications on file prior to visit.      Medications reviewed, reconciled with patients and changes were made          Immunizations:  Immunization History   Administered Date(s) Administered    Td 08/28/1986    Tdap 12/06/2006         Social History:  Social History     Social History    Marital status: Divorced     Spouse name: N/A    Number of children: N/A    Years of education: N/A     Occupational History    Not on file.     Social History Main Topics    Smoking status: Former Smoker  Packs/day: 1.00     Years: 10.00     Quit date: 11/15/1972    Smokeless tobacco: Not on file    Alcohol use No    Drug use: No    Sexual activity: Not Currently     Other Topics Concern    Not on file     Social History Narrative    Family Hx      Father age 70 has asthma and has had an MI. Bikes and walks, myelodysplastic syndrome    Mother age 22 has died. She had a degenerative neurologic disease     Brother age 13 has hyperlipidemia,     Brother died age 62 leukemia            Personal Hx     Divorced for 26 years. She's had no children.  Discussed she is in a new relationship.    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               Health Maintenance:  Health Maintenance   Topic Date Due    BREAST CANCER SCREENING  06/19/2013    DXA-OSTEOPENIA-EVERY 3 YEARS  12/12/2013    COLON CANCER SCREENING 10 YEAR COLONOSCOPY  11/09/2014    IMM-INFLUENZA (1) 04/29/2015    HIV TESTING OFFERED  Completed    IMM-ZOSTER 60 YRS +  Addressed    HEPATITIS C SCREENING  OFFERED  Completed         [id number] Health: No changes in appetite or weight. No fever, chills, or sweats. No fatigue or pain.   Skin: No sores or rashes. No changes in hair or nails.   Bleeding/immunity: No enlarged glands. No abnormal bleeding.   Endocrine: No heat or cold intolerance. No excessive thirst or urination.   Musculoskeletal: No muscle weakness or pain. No joint swelling or pain.   EENT:  No loss of hearing or ringing in ears. No sore throat or hoarseness.   Head and neck: No headache, dizziness, or lightheadedness.   Heart and lung: No chest pain, SOB, palpitations. No wheezing, cough, or sputum. No leg swelling or pain with walking.   GI: No trouble swallowing or heartburn. No abdominal pain. No nausea or vomiting. No constipation or diarrhea.   Urinary: No increased frequency, burning, urgency, or incontinence. No blood in urine.   Neuropsych: No numbness or tingling. No loss of balance, or trouble walking. No seizures. No depression, anxiety, or suicidal thoughts. No poor judgment, memory, or attention.No fainting or loss of consciousness.       Physical Exam:  Vitals:    04/20/15 1125   BP: 118/68   Pulse: 64   Temp: 37.3 C (99.2 F)   Weight: 53.2 kg (117 lb 3.2 oz)   Height: 1.613 m (5' 3.5")     Body mass index is 20.44 kg/(m^2).   SpO2 Readings from Last 3 Encounters:   04/20/15 99%   04/17/14 99%   03/09/14 98%     GENERAL:  Alert and oriented; no acute distress  SKIN: Normal, no  lesions consistent with skin cancer or melanoma  HEAD:  normal  EYES:   --lids:  normal  --conjunctiva:  normal  --pupils:  normal  --sclera:  normal  --EOM:  normal  EARS, NOSE, THROAT:  --ext. ear:  normal  --T.M.'s:  normal  --nares:  normal  --teeth:  normal  --gums:  normal  --tonsils:  normal  --throat:  normal  NECK:  --  ROM:  normal  --nodes:  negative  --carotids: normal, JVP flat  --bruits:  none heard   --thyroid:  normal, no nodules  HEART:  RRR, S1S2, no m/g/r  LUNGS:  clear bilaterally, no rales,  rhonchi, crackles  CHEST:  normal  BREASTS:    --skin:  normal  --masses:  none  --nipples:  normal  --discharge:  none  --nodes:  none  BACK:  normal, no kyphosis or scoliosis  LYMPH: No cervical, clavicular, axilla, or inguinal adenopathy  ABDOMEN:    --softness:  normal  --tenderness:  non-tender  --distension:  none  --organs:  no HSM  --bruits:  none  --pulsatile mass:  none  RECTAL:  Not indicated  GENITALIA: deferred to GYN  EXTREMITIES:  --edema:  none  --pulses:  normal, dorsalis pedis posterior tibial pulses 2+ bilaterally  --joints:  normal  --nails:   normal    NEUROLOGIC:  Nonfocal exam, no abnormalities              12 LEAD ELECTROCARDIOGRAM:  Normal sinus rhythm, rate:74  , axis  90  : PR, QRS intervals normal;ST T-wave segments normal no ischemia.  Impression:  Normal ECG      Labs:  Recent Results (from the past 336 hour(s))   Lipid add Rfx to Drt LDL if Trig >400    Collection Time: 04/09/15  7:57 AM   Result Value Ref Range    Cholesterol 184 mg/dL    Triglycerides 62 mg/dL    HDL 103 mg/dL    LDL Calculated 69 mg/dL    Non HDL Cholesterol 81 mg/dL    Chol/HDL Ratio 1.8    Comprehensive metabolic panel    Collection Time: 04/09/15  7:57 AM   Result Value Ref Range    Sodium 140 133 - 145 mmol/L    Potassium 4.5 3.3 - 5.1 mmol/L    Chloride 101 96 - 108 mmol/L    CO2 28 20 - 28 mmol/L    Anion Gap 11 7 - 16    UN 14 6 - 20 mg/dL    Creatinine 0.92 0.51 - 0.95 mg/dL    GFR,Caucasian 67 *    GFR,Black 77 *    Glucose 84 60 - 99 mg/dL    Calcium 9.0 8.6 - 10.2 mg/dL    Total Protein 6.2 (L) 6.3 - 7.7 g/dL    Albumin 4.3 3.5 - 5.2 g/dL    Bilirubin,Total 0.5 0.0 - 1.2 mg/dL    AST 24 0 - 35 U/L    ALT 20 0 - 35 U/L    Alk Phos 68 35 - 105 U/L   Urinalysis with reflex to microscopic    Collection Time: 04/09/15  7:57 AM   Result Value Ref Range    Color, UA Lt Yellow Yellow    Appearance,UR 1+ Cloudy (A) Clear    Specific Gravity,UA 1.014 1.002 - 1.030    Leuk Esterase,UA NEG NEGATIVE    Nitrite,UA NEG  NEGATIVE    pH,UA 7.0 5.0 - 8.0    Protein,UA NEG NEGATIVE mg/dL    Glucose,UA NORM mg/dL    Ketones, UA NEG NEGATIVE    Blood,UA NEG NEGATIVE   CBC and differential    Collection Time: 04/09/15  7:57 AM   Result Value Ref Range    WBC 4.5 4.0 - 10.0 THOU/uL    RBC 4.5 3.9 - 5.2 MIL/uL    Hemoglobin 14.0 11.2 - 15.7 g/dL    Hematocrit 42  34 - 45 %    MCV 93 79 - 95 fL    MCH 31 26 - 32 pg/cell    MCHC 34 32 - 36 g/dL    RDW 13.3 11.7 - 14.4 %    Platelets 216 160 - 370 THOU/uL    Seg Neut % 62.3 %    Lymphocyte % 18.7 %    Monocyte % 9.1 %    Eosinophil % 7.6 %    Basophil % 1.6 %    Neut # K/uL 2.8 1.6 - 6.1 THOU/uL    Lymph # K/uL 0.8 (L) 1.2 - 3.7 THOU/uL    Mono # K/uL 0.4 0.2 - 0.9 THOU/uL    Eos # K/uL 0.3 0.0 - 0.4 THOU/uL    Baso # K/uL 0.1 0.0 - 0.1 THOU/uL    Nucl RBC % 0.0 0.0 - 0.2 /100 WBC    Nucl RBC # K/uL 0.0 0.0 - 0.0 THOU/uL    IMM Granulocytes # 0.0 0.0 - 0.1 THOU/uL    IMM Granulocytes 0.7 %   TSH    Collection Time: 04/09/15  7:57 AM   Result Value Ref Range    TSH 1.00 0.27 - 4.20 uIU/mL   Vitamin B12    Collection Time: 04/09/15  7:57 AM   Result Value Ref Range    Vitamin B12 695 211 - 946 pg/mL   Hemoglobin A1c    Collection Time: 04/09/15  7:57 AM   Result Value Ref Range    Hemoglobin A1C 5.2 4.0 - 6.0 %              1. Routine general medical examination at a health care facility     2. Depression in remission     3. Pulmonary nodules       Assessment and Plan  53: 64 year old woman is doing quite well. She feels the best she has felt in years. She is exercising Her depression remains in remission and Geodon will be discontinued. Continue fluoxetine 60 mg  2: Labs are excellent.  3: Pulmonary nodules declines followup CT scan  4: Declines Zostavax  5: Overdue for colonoscopy mammogram and bone density test. These will be scheduled  Follow-up: One year    Counseling given:   --Immunizations- declined zostavax   --Health care proxy discussed; up to date     --Colon CA screening  Discussed  ovr due  --Diabetes screening tests discussed up to date;   --Diabetes outpatient self-management training services discussed    --Mammogram screening discussed Over due  -- Osteoporosis prevention discussed;  DEXA  Over due    --Cardiac risk factor modification and lipid tests discussed; up to date;   --Wearing seatbelts discussed  --Alcohol/drug use discussed  --Exercise discussed

## 2015-04-21 LAB — VITAMIN D
25-OH VIT D2: <1 ng/mL
25-OH VIT D3: 37.4 ng/mL
25-OH Vit Total: 37.4 ng/mL (ref 30.0–80.0)

## 2015-04-26 ENCOUNTER — Other Ambulatory Visit: Payer: Self-pay | Admitting: Primary Care

## 2015-04-28 ENCOUNTER — Encounter: Payer: Self-pay | Admitting: Ophthalmology

## 2015-04-28 ENCOUNTER — Ambulatory Visit: Payer: Self-pay | Admitting: Ophthalmology

## 2015-04-28 DIAGNOSIS — H43812 Vitreous degeneration, left eye: Secondary | ICD-10-CM

## 2015-04-30 NOTE — Progress Notes (Signed)
HPI     1 month f/u - PVD OS  Hx: PVD OD  Pt states she is still having flashes OS. Pt is notices larger floaters   OS. No pain OU. Pt is using artificial tears 1-2 daily OU.        Last edited by Shawnie Pons, MD on 04/28/2015 10:54 AM.     [facility] Eye Exam     Visual Acuity (Snellen - Linear)      Right Left   Dist cc 20/20 20/20         Tonometry (Tonopen, 10:20 AM)      Right Left   Pressure 18 18         Pupils      APD   Right None   Left None         Visual Fields      Left Right   Result Full Full         Extraocular Movement      Right Left   Result Full, Ortho Full, Ortho         Neuro/Psych     Oriented x3:  Yes    Mood/Affect:  Normal      Dilation     Both eyes:  1.0% Tropicamide @ 10:20 AM            Slit Lamp and Fundus Exam     External Exam      Right Left    External Normal ocular adnexae, lacrimal gland & drainage, orbits Normal ocular adnexae, lacrimal gland & drainage, orbits      Slit Lamp Exam      Right Left    Lids/Lashes Normal structure & position Normal structure & position    Conjunctiva/Sclera Normal bulbar/palpebral, conjunctiva, sclera Normal bulbar/palpebral, conjunctiva, sclera    Cornea Normal epithelium, stroma, endothelium, tear film Normal epithelium, stroma, endothelium, tear film    Anterior Chamber Clear & deep Clear & deep    Iris Normal shape, size, morphology Normal shape, size, morphology    Lens Normal cortex, nucleus, anterior/posterior capsule, clarity Normal cortex, nucleus, anterior/posterior capsule, clarity      Fundus Exam      Right Left    Vitreous PVD with floaters PVD with WEiss, floaters    Disc Normal size, appearance, nerve fiber layer Normal size, appearance, nerve fiber layer    Macula Normal Normal    Vessels Normal Normal    Periphery all attached no tears no retinal tears - depressed 360                PVD (posterior vitreous detachment), left  Doing well OS no breaks, no cells - reassured  However pt still noticing flashes which have not decreased  significantly  Reviewed ssx pt aware to call if a problem  Otherwise 2 mo

## 2015-04-30 NOTE — Assessment & Plan Note (Addendum)
Doing well OS no breaks, no cells - reassured  However pt still noticing flashes which have not decreased significantly  Reviewed ssx pt aware to call if a problem  Otherwise 2 mo

## 2015-06-08 ENCOUNTER — Encounter: Payer: Self-pay | Admitting: Primary Care

## 2015-06-08 ENCOUNTER — Ambulatory Visit: Payer: Self-pay | Admitting: Primary Care

## 2015-06-08 VITALS — BP 126/62 | HR 85 | Temp 98.4°F | Ht 63.5 in | Wt 116.8 lb

## 2015-06-08 DIAGNOSIS — R042 Hemoptysis: Secondary | ICD-10-CM

## 2015-06-08 MED ORDER — AZITHROMYCIN 250 MG PO TABS *I*
ORAL_TABLET | ORAL | 0 refills | Status: DC
Start: 2015-06-08 — End: 2015-06-18

## 2015-06-08 MED ORDER — AMPHETAMINE-DEXTROAMPHETAMINE 15 MG PO CP24 *I*
15.0000 mg | ORAL_CAPSULE | Freq: Every morning | ORAL | 0 refills | Status: DC
Start: 2015-06-08 — End: 2015-09-09

## 2015-06-08 MED ORDER — AZITHROMYCIN 250 MG PO TABS *I*
ORAL_TABLET | ORAL | 0 refills | Status: DC
Start: 2015-06-08 — End: 2015-06-08

## 2015-06-08 NOTE — Progress Notes (Signed)
Jackie Moran is a 64 y.o. female Comes in with concerns of recurrent hemoptysis. She had hemoptysis about 3 years ago and was thoroughly worked up by pulmonary. CT scan shows some scattered groundglass nodules and bronchiectatic changes of the right middle lobe. She was recommended to have regular serial CT scans to screen for possible malignancy but she has declined to do that. She wants to minimize radiation exposure.    Over the weekend for the first time she had another episode of hemoptysis since last year. She had 2 episodes of hemoptysis coughing up clots of blood. No respiratory symptoms. No fever. No sputum. 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    Ganglion cyst of left foot M67.472    Left foot pain M79.672    Tendinitis of foot M77.50    PVD (posterior vitreous detachment), left H43.812       ALLERGIES:  Egg; Gluten meal; Milk; Amoxicillin; Doxycycline hyclate; Penicillins; Environmental allergies; Food; and No known latex allergy    Current Outpatient Prescriptions on File Prior to Visit   Medication Sig Dispense Refill    amphetamine-dextroamphetamine (ADDERALL XR) 20 MG 24 hr capsule Take 1 capsule (20 mg total) by mouth every morning   Max daily dose: 20 mg Code B 90 capsule 0    FLUoxetine (PROZAC) 20 MG capsule Take 3 capsules by mouth  daily 270 capsule 3    estrogen conjugated-medroxyPROGESTERone (PREMPRO) 0.3-1.5 MG per tablet Take 1 tablet by mouth daily      Ascorbic Acid (VITAMIN C PO) Take by mouth      Probiotic Product (PROBIOTIC DAILY PO) Take by mouth      Calcium 500 MG tablet 2 times daily      Cholecalciferol (VITAMIN D) 1000 UNIT tablet TAKE 1 TABLET DAILY. 1 0     Multiple Vitamin (MULTIVITAMIN) capsule TAKE 1 CAPSULE DAILY.  0     No current facility-administered medications on file prior to visit.      Medications reviewed, reconciled with patients and changes were made      Exam:  Vitals:    06/08/15 0846   BP: 126/62   Pulse: 85   Temp: 36.9 C (98.4 F)   Weight: 53 kg (116 lb 12.8 oz)   Height: 1.613 m (5' 3.5")    Body mass index is 20.37 kg/(m^2).    SpO2 Readings from Last 3 Encounters:   06/08/15 98%   04/20/15 99%   04/17/14 99%     Appears well in no distress.  HEENT: No adenopathy in the anterior-posterior cervical chain.  No clavicular adenopathy.  Oropharynx clear  Neck veins flat.  No thyroid nodules.    Heart: RRR S1-S2 without murmurs  Lungs: Clear to auscultation. There is inspiratory wheeze over the right posterior middle lobe      Chest x-ray is clear      1. Hemoptysis  *Chest standard frontal and lateral views     Hemoptysis with expiratory wheeze over right middle lobe. Her chest x-ray is clear. No symptoms of infection but she does have no bronchiectatic changes in the  right middle lobe. She does not wish to have a CT scan this time. Will treat empirically with a course of azithromycin for possible bronchiectasis flare. If hemoptysis continues she does agree to a repeat CT scan

## 2015-06-09 ENCOUNTER — Telehealth: Payer: Self-pay | Admitting: Ophthalmology

## 2015-06-09 ENCOUNTER — Ambulatory Visit: Payer: Self-pay | Admitting: Ophthalmology

## 2015-06-09 NOTE — Telephone Encounter (Signed)
06/09/2015    I wanted you to be aware that the following patient has cancelled their appointment and chose to reschedule.    Provider Name: Boyd Kerbs   Date of Appointment: 06/09/15   Patient Name: Jackie Moran   MRN: [id number]   DOB: 04/28/1951       Reason for Cancellation:  Patient is sick.  Rescheduled 06/30/2015.  11:15        Thank you,  Odis Luster

## 2015-06-11 ENCOUNTER — Telehealth: Payer: Self-pay | Admitting: Primary Care

## 2015-06-11 ENCOUNTER — Other Ambulatory Visit: Payer: Self-pay | Admitting: Primary Care

## 2015-06-11 NOTE — Telephone Encounter (Signed)
pls call why is there a med request for zpak in computer?

## 2015-06-11 NOTE — Telephone Encounter (Signed)
Read below

## 2015-06-11 NOTE — Telephone Encounter (Signed)
Spoke to patient, she didn't request this refill on th antibiotic, she already picked up her medication from her last visit with Dr. [name], she will call the pharmacy to inform them this is an error on their end.

## 2015-06-14 NOTE — Assessment & Plan Note (Signed)
0

## 2015-06-16 ENCOUNTER — Telehealth: Payer: Self-pay | Admitting: Primary Care

## 2015-06-16 NOTE — Telephone Encounter (Signed)
Call reference # [id number]. [facility] needs clarification Geodon and Zithromax that were prescribed 06/08/15? [phone] option 1.

## 2015-06-16 NOTE — Telephone Encounter (Signed)
Notified the pharmacy, they will cancel the order for the z-pack, and note she's off the Geodon.

## 2015-06-16 NOTE — Telephone Encounter (Signed)
Tell them she is off Geodon and zpak was filled locally

## 2015-06-18 ENCOUNTER — Telehealth: Payer: Self-pay | Admitting: Primary Care

## 2015-06-18 MED ORDER — AZITHROMYCIN 250 MG PO TABS *I*
ORAL_TABLET | ORAL | 0 refills | Status: AC
Start: 2015-06-18 — End: 2015-06-23

## 2015-06-18 NOTE — Telephone Encounter (Signed)
I have renewed another round of Zpak

## 2015-06-18 NOTE — Telephone Encounter (Signed)
Pt called,  Had an office visit with Dr. [name] on 10/11 for URI, states she is coughing dark green phlegm this morning. Did not check her temperature but feels achy.   Does not believe Infection is gone.  States she had a bad case of Pneumonia three years ago and is being cautious.  Pt believes she needs another round of Antibiotics.  Please advise and call pt back.

## 2015-06-30 ENCOUNTER — Ambulatory Visit: Payer: Self-pay | Admitting: Ophthalmology

## 2015-06-30 ENCOUNTER — Encounter: Payer: Self-pay | Admitting: Ophthalmology

## 2015-06-30 DIAGNOSIS — H43812 Vitreous degeneration, left eye: Secondary | ICD-10-CM

## 2015-06-30 NOTE — Assessment & Plan Note (Signed)
Floaters have diminished, occas flashes overall better  No breaks  Reviewed s/sx and pt aware to call if any problems  Otherwise prn  Has regular eye doctor for glasses

## 2015-06-30 NOTE — Progress Notes (Signed)
HPI     Now 52mo f/u -PVD/ flashes OS (mc)    Hx: PVD OD  Pt states that flashes have mostly gone away, every once in a   while but not typical. Vision is stable and good. Occasional dryness, AFT   help.   AFT 1-2x per day       Last edited by Shawnie Ponshung, Bennett Ram, MD on 06/30/2015 11:36 AM.     [facility] Eye Exam     Visual Acuity (Snellen - Linear)      Right Left   Dist cc 20/20 20/20       Correction:  Glasses      Tonometry (Tonopen, 11:20 AM)      Right Left   Pressure 15 19         Pupils      Dark Light APD   Right 3 2 None   Left 3 2 None         Visual Fields      Left Right   Result Full Full         Extraocular Movement      Right Left   Result Full, Ortho Full, Ortho         Neuro/Psych     Oriented x3:  Yes    Mood/Affect:  Normal      Dilation     Both eyes:  1.0% Mydriacyl @ 11:21 AM            Slit Lamp and Fundus Exam     External Exam      Right Left    External Normal ocular adnexae, lacrimal gland & drainage, orbits Normal ocular adnexae, lacrimal gland & drainage, orbits      Slit Lamp Exam      Right Left    Lids/Lashes Normal structure & position Normal structure & position    Conjunctiva/Sclera Normal bulbar/palpebral, conjunctiva, sclera Normal bulbar/palpebral, conjunctiva, sclera    Cornea Normal epithelium, stroma, endothelium, tear film Normal epithelium, stroma, endothelium, tear film    Anterior Chamber Clear & deep Clear & deep    Iris Normal shape, size, morphology Normal shape, size, morphology    Lens Normal cortex, nucleus, anterior/posterior capsule, clarity Normal cortex, nucleus, anterior/posterior capsule, clarity      Fundus Exam      Right Left    Vitreous PVD with floaters PVD with Janann AugustWeiss, floaters    Disc Normal size, appearance, nerve fiber layer Normal size, appearance, nerve fiber layer    Macula Normal Normal    Vessels Normal Normal    Periphery all attached no tears all attached, no breaks                PVD (posterior vitreous detachment), left  Floaters have diminished, occas  flashes overall better  No breaks  Reviewed s/sx and pt aware to call if any problems  Otherwise prn  Has regular eye doctor for glasses

## 2015-07-07 ENCOUNTER — Other Ambulatory Visit: Payer: Self-pay | Admitting: Gastroenterology

## 2015-07-08 ENCOUNTER — Other Ambulatory Visit: Payer: Self-pay | Admitting: Gastroenterology

## 2015-07-08 LAB — HM MAMMOGRAPHY

## 2015-07-09 ENCOUNTER — Encounter: Payer: Self-pay | Admitting: Primary Care

## 2015-07-21 ENCOUNTER — Encounter: Payer: Self-pay | Admitting: Primary Care

## 2015-07-21 LAB — HM DEXA SCAN

## 2015-07-29 ENCOUNTER — Encounter: Payer: Self-pay | Admitting: Primary Care

## 2015-08-03 ENCOUNTER — Telehealth: Payer: Self-pay | Admitting: Primary Care

## 2015-08-03 NOTE — Telephone Encounter (Signed)
-----   Message from Saddie BendersGeoffrey G Morris, MD sent at 07/20/2015  8:22 AM EST -----   Please let her know DEXA scan was excellent, near normal, repeat in 3-5 years  ----- Message -----     From: Cassell ClementHummel, Diane M     Sent: 07/20/2015   3:58 AM       To: Saddie BendersGeoffrey G Morris, MD

## 2015-08-03 NOTE — Telephone Encounter (Signed)
Notified patient.

## 2015-08-26 ENCOUNTER — Encounter: Payer: Self-pay | Admitting: Primary Care

## 2015-08-26 ENCOUNTER — Ambulatory Visit: Payer: Self-pay | Admitting: Primary Care

## 2015-08-26 VITALS — BP 102/66 | HR 75 | Temp 98.6°F | Ht 63.0 in | Wt 116.4 lb

## 2015-08-26 DIAGNOSIS — R042 Hemoptysis: Secondary | ICD-10-CM

## 2015-08-26 NOTE — Progress Notes (Signed)
Reason for visit: Hemoptysis      HPI: She is here for evaluation of 2 episodes of hemoptysis in the last week.  She had a history of hemoptysis 3 years ago and had a negative pulmonary workup including bronchoscopy no diagnosis found.  She has been seen since that time with hemoptysis.  She recently had an episode in October and was treated with Zithromax 2 courses .  She said she feels like that might have helped her symptoms.  Last week she had one episode of coughing up blood and had another one yesterday.  She brought in her Clarinex which showed a clot and then some blood.  She has had only mild cold symptoms mild headache or sore throat nasal congestion but nothing severe.  She's not had fevers.  No shortness of breath.  Her weight has been stable.  She does not take aspirin but she has been taking some Aleve for her mild viral sounding symptoms.  She has not had any significant recent travel history.  She does not have any known autoimmune illness .  She has been advised to have a follow-up CT scan after her recent episodes but deferred. she does have some postnasal drainage      Allergies:Egg; Gluten meal; Milk; Amoxicillin; Doxycycline hyclate; Penicillins; Environmental allergies; Food; and No known latex allergy      Meds:@  Current Outpatient Prescriptions on File Prior to Visit   Medication Sig Dispense Refill    polyvinyl alcohol (LIQUIFILM TEARS) 1.4 % ophthalmic solution Place 1 drop into both eyes as needed for Dry Eyes      amphetamine-dextroamphetamine (ADDERALL XR) 15 MG 24 hr capsule Take 1 capsule (15 mg total) by mouth every morning   Max daily dose: 15 mg Code B 90 capsule 0    FLUoxetine (PROZAC) 20 MG capsule Take 3 capsules by mouth  daily 270 capsule 3    estrogen conjugated-medroxyPROGESTERone (PREMPRO) 0.3-1.5 MG per tablet Take 1 tablet by mouth daily      Ascorbic Acid (VITAMIN C PO) Take by mouth      Probiotic Product (PROBIOTIC DAILY PO) Take by mouth      Calcium 500 MG  tablet 2 times daily      Cholecalciferol (VITAMIN D) 1000 UNIT tablet TAKE 1 TABLET DAILY. 1 0    Multiple Vitamin (MULTIVITAMIN) capsule TAKE 1 CAPSULE DAILY.  0     No current facility-administered medications on file prior to visit.          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        Pleasant well-appearing in no distress  HEENT: Anicteric, conjunctiva pink.  Oropharynx unremarkable.  Posterior pharynx unremarkable.  no dried blood  No lymphadenopathy, JVD, bruits, no thyromegaly  LUNGS: Clear throughout wheezes rhonchi  CARDIOVASCULAR: Regular rate and rhythm, normal S1-S2, no murmur rub gallop.  No lower extremity edema , pulses intact    Assessment and Plan: Patient here for evaluation of 2 episodes of hemoptysis in 1 week, does not appear acutely ill has not been coughing significantly.   I advised her to follow through with the CT scan is ordered by Dr. Langston Masker total months ago and make a follow-up appointment to discuss the findings with him.  If she worsens she will let me know

## 2015-08-26 NOTE — Patient Instructions (Signed)
Set up the CT scan [phone]    F/u with Dr [name]    If you have more bleeding let us know

## 2015-09-02 ENCOUNTER — Telehealth: Payer: Self-pay | Admitting: Primary Care

## 2015-09-02 ENCOUNTER — Other Ambulatory Visit: Payer: Self-pay | Admitting: Primary Care

## 2015-09-02 MED ORDER — AZITHROMYCIN 250 MG PO TABS *I*
ORAL_TABLET | ORAL | 0 refills | Status: AC
Start: 2015-09-02 — End: 2015-09-07

## 2015-09-02 NOTE — Telephone Encounter (Signed)
Left a message to return my call.

## 2015-09-02 NOTE — Telephone Encounter (Signed)
-----   Message from Saddie BendersGeoffrey G Morris, MD sent at 09/02/2015 10:56 AM EST -----  Please notify patient CT chest is fine, no change in pulmonary nodules.  The is a mild bronchitis, I have ordered a zpak for her to coughing up blood

## 2015-09-02 NOTE — Progress Notes (Signed)
Please notify patient CT chest is fine, no change in pulmonary nodules.  The is a mild bronchitis, I have ordered a zpak for her to coughing up blood

## 2015-09-02 NOTE — Telephone Encounter (Signed)
Patient called back and was given message.

## 2015-09-09 ENCOUNTER — Ambulatory Visit: Payer: Self-pay | Admitting: Primary Care

## 2015-09-09 ENCOUNTER — Encounter: Payer: Self-pay | Admitting: Primary Care

## 2015-09-09 VITALS — BP 114/72 | HR 75 | Temp 98.5°F | Ht 62.99 in | Wt 118.0 lb

## 2015-09-09 DIAGNOSIS — J479 Bronchiectasis, uncomplicated: Secondary | ICD-10-CM

## 2015-09-09 DIAGNOSIS — R042 Hemoptysis: Secondary | ICD-10-CM

## 2015-09-09 MED ORDER — AMPHETAMINE-DEXTROAMPHETAMINE 15 MG PO CP24 *I*
15.0000 mg | ORAL_CAPSULE | Freq: Every morning | ORAL | 0 refills | Status: DC
Start: 2015-09-09 — End: 2015-12-15

## 2015-09-09 NOTE — Student Note (Signed)
Subjective  UJW:JXBJYNHPI:Jackie Moran is a 65 y.o. female presenting for a follow up on her hemoptysis. Her most recent episode of hemoptysis was in December, when she experienced coughing up bright red blood in her mucus twice about one week apart. Her most recent episode felt similar to her past hemoptysis in October. She believed antibiotics seemed to help prevent reoccurrence both in October and in December. Her hemoptysis has not been associated with coughing, shortness of breath, or chest pain. She had symptoms of what she believed was a cold a few weeks prior to her most recent hemoptysis episode, including pharyngitis, post-nasal drip, rhinorrhea, and rhinitis, but has otherwise not had any recent illness. She reports feeling a bit more tired than usual, possibly from the holiday season, but has not experienced any shortness of breath or chest pain.      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    Ganglion cyst of left foot M67.472    Left foot pain M79.672    Tendinitis of foot M77.50    PVD (posterior vitreous detachment), left H43.812    ERRONEOUS ENCOUNTER--DISREGARD Z53.21       ALLERGIES:  Egg; Gluten meal; Milk; Amoxicillin; Doxycycline hyclate; Penicillins; Environmental allergies; Food; and No known latex allergy    Current Outpatient Prescriptions on File Prior to Visit   Medication Sig Dispense Refill    polyvinyl alcohol (LIQUIFILM TEARS) 1.4 % ophthalmic solution Place 1 drop into both eyes as needed for Dry Eyes      FLUoxetine (PROZAC) 20 MG capsule Take 3 capsules by mouth  daily 270 capsule 3    estrogen conjugated-medroxyPROGESTERone (PREMPRO) 0.3-1.5 MG per tablet Take 1 tablet by mouth daily      Ascorbic Acid  (VITAMIN C PO) Take by mouth      Probiotic Product (PROBIOTIC DAILY PO) Take by mouth      Calcium 500 MG tablet 2 times daily      Cholecalciferol (VITAMIN D) 1000 UNIT tablet TAKE 1 TABLET DAILY. 1 0    Multiple Vitamin (MULTIVITAMIN) capsule TAKE 1 CAPSULE DAILY.  0     No current facility-administered medications on file prior to visit.      Medications reviewed, reconciled with patients and changes were made    Objective  Exam:  Vitals:    09/09/15 1453   BP: 114/72   Pulse: 75   Temp: 36.9 C (98.5 F)   Weight: 53.5 kg (118 lb)   Height: 1.6 m (5' 2.99")    Body mass index is 20.91 kg/(m^2).    SpO2 Readings from Last 3 Encounters:   09/09/15 99%   08/26/15 96%   06/08/15 98%     General: Appears well, NAD  Lungs: Clear to auscultation bilaterally    CT: In the left upper lobe, there is a 7-mm nodule and a 6-mm nodule, which are unchanged. In the right upper lobe, there is groundglass opacity. There is mildly increased right middle lobe parenchymal consolidation with mild bronchiectasis. No pleural effusion.      Assessment and Plan  1. Bronchiectasis     2. Hemoptysis 11/13, 10/16     1.  The inflammatory right middle lobe parenchymal consolidation with a history of hemoptysis is consistent with bronchiectasis. Differential diagnosis includes lung cancer, which is unlikely based on the unchanging nodules seen on repeat CT imaging and the patient's resolution of symptoms after a course of antibiotics.The patient was educated on bronchiectasis and the expected prognosis, including the possible need for  future CT imaging should her hemoptysis in the future change from her previous episodes.    THIS IS A MEDICAL STUDENT NOTE. REFER TO ATTENDING NOTE FOR FINAL DIAGNOSIS AND TREATMENT.

## 2015-09-09 NOTE — Progress Notes (Signed)
AVW:UJWJXBHPI:Jackie Moran is a 65 y.o. female for a follow up on her hemoptysis. Her most recent episode of hemoptysis was in December, when she experienced coughing up bright red blood in her mucus twice about one week apart. Her most recent episode felt similar to her past hemoptysis in October 2015. She believed antibiotics seemed to help prevent reoccurrence both in October and in December. Her hemoptysis has not been associated with coughing, shortness of breath, or chest pain. She had symptoms of what she believed was a cold a few weeks prior to her most recent hemoptysis episode, including pharyngitis, post-nasal drip, rhinorrhea, and rhinitis, but has otherwise not had any recent illness. She reports feeling a bit more tired than usual, possibly from the holiday season, but has not experienced any shortness of breath or chest pain. She had repeat CT chest last week and returns for discussion.  After treatment with azithromycin for RML inflammation cough has resolved    Past evaluation has included serial CT scans which showed stable small pulmonary nodules. 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    Ganglion cyst of left foot M67.472    Left foot pain M79.672    Tendinitis of foot M77.50    PVD (posterior vitreous detachment), left H43.812    ERRONEOUS ENCOUNTER--DISREGARD Z53.21       ALLERGIES:  Egg; Gluten meal; Milk; Amoxicillin; Doxycycline hyclate; Penicillins; Environmental allergies; Food; and No known latex allergy    Current Outpatient Prescriptions on File Prior to Visit   Medication Sig Dispense Refill    polyvinyl alcohol (LIQUIFILM TEARS) 1.4 % ophthalmic solution Place 1 drop into both  eyes as needed for Dry Eyes      amphetamine-dextroamphetamine (ADDERALL XR) 15 MG 24 hr capsule Take 1 capsule (15 mg total) by mouth every morning   Max daily dose: 15 mg Code B 90 capsule 0    FLUoxetine (PROZAC) 20 MG capsule Take 3 capsules by mouth  daily 270 capsule 3    estrogen conjugated-medroxyPROGESTERone (PREMPRO) 0.3-1.5 MG per tablet Take 1 tablet by mouth daily      Ascorbic Acid (VITAMIN C PO) Take by mouth      Probiotic Product (PROBIOTIC DAILY PO) Take by mouth      Calcium 500 MG tablet 2 times daily      Cholecalciferol (VITAMIN D) 1000 UNIT tablet TAKE 1 TABLET DAILY. 1 0    Multiple Vitamin (MULTIVITAMIN) capsule TAKE 1 CAPSULE DAILY.  0     No current facility-administered medications on file prior to visit.      Medications reviewed, reconciled with patients and changes were made      Exam:  Vitals:    09/09/15 1453   BP: 114/72   Pulse: 75   Temp: 36.9 C (98.5 F)   Weight: 53.5 kg (118 lb)   Height: 1.6 m (5' 2.99")    Body mass index is 20.91 kg/(m^2).    SpO2 Readings from Last 3 Encounters:   09/09/15 99%   08/26/15 96%   06/08/15 98%     Appears well in no distress.  HEENT: No adenopathy in the anterior-posterior cervical chain.  No clavicular adenopathy.  Oropharynx clear  Neck veins flat.  No thyroid nodules.    Heart: RRR S1-S2 without murmurs  Lungs: Clear to auscultation    CT chest:  Stable subcentimeter pulmonary nodules which are likely inflammatory. Bronchietasis of RML      Mildly increased right middle lobe parenchymal disease, likely    inflammatory.             1. Bronchiectasis     2. Hemoptysis 11/13, 10/16       Recurrent hemoptysis. This is happened at least 4 times since 2013. Serial CT scans since 2013 did not demonstrate any underlying malignancy or changing lymph nodes or pulmonary nodules. She has developed bronchiectasis in the right middle lobe which is likely the origin of her episodic hemoptysis. CT scan suggests right lower lobe infiltrates  consistent with pneumonia from bronchiectasis. After treatment with azithromycin she feels much better. No cough.    For future episodes of mild hemoptysis who apparently retreat with antibiotic. However if hemoptysis is substantial will need followup CT scan.    Recommended by MyChart after she left the office pneumonia immunization and influenza vaccination

## 2015-09-10 ENCOUNTER — Telehealth: Payer: Self-pay | Admitting: Primary Care

## 2015-09-10 NOTE — Telephone Encounter (Signed)
Mailed home copy of CT results per Dr. [name].

## 2015-09-11 ENCOUNTER — Encounter: Payer: Self-pay | Admitting: Primary Care

## 2015-09-21 ENCOUNTER — Ambulatory Visit
Admission: RE | Admit: 2015-09-21 | Discharge: 2015-09-21 | Disposition: A | Discharge status: Home / Self Care | Payer: Self-pay | Source: Ambulatory Visit | Attending: Gastroenterology | Admitting: Gastroenterology

## 2015-09-21 ENCOUNTER — Other Ambulatory Visit: Payer: Self-pay | Admitting: Gastroenterology

## 2015-09-21 NOTE — Procedures (Signed)
[facility]    Colonoscopy    Date of Procedure: 09/21/2015   Primary Physician: Saddie Benders, MD        Attending Physician: Beckie Busing. Victorio Palm, M.D.  Patient Name: Jackie Moran      Indications: Colorectal cancer screening-average risk    Previous colonoscopy: Yes -- Date: 12 years     Medications:Fentanyl 100 mcg IV and Midazolam 4 mg IV were administered incrementally over the course of the procedure to achieve an adequate level of conscious sedation.    Pre-Procedure Physical:  Patient's medications, allergies, past medical, surgical, social and family histories were reviewed and updated as appropriate.    BP 123/69, P 75   Airway:  normal  Heart:  normal S1 and S2  Lungs:  clear  Abdomen:  soft, nontender, normal bowel sounds  Mental Status:  awake and alert; oriented to person, place, and time     ASA Classification:  2    Informed Consent: Prior to the procedure, the patient was explained the risk, benefits and alternatives including the risk of bleeding, infection or perforation and informed consent was obtained.     Procedure Details: The patient was placed in the left lateral decubitus position and monitored continuously with ECG tracing, pulse oximetry, blood pressure monitoring and direct observations. After anorectal examination was performed, the Olympus PCF-H180was inserted into the rectum and advanced under direct vision to the terminal ileum. The procedure was considered more difficult than average. Additional maneuvers used: repositioned patient on back and applied abdominal pressure.  Cecum withdrawal time:  9 minutes.    Narrative:  During withdrawal examination, the final quality of the prep was [facility] Main Bowel Prep Scale   Prep:    Right Colon: Grade3- (entire mucosa of colon segment seen well, with no residual staining, small fragments of stool, or opaque liguid)    Transverse Colon: Grade 3- (entire mucosa of colon segment seen well, with no  residual staining, small fragments of stool, or opaque liguid)    Left Colon: Grade 3- (entire mucosa of colon segment seen well, with no residual staining, small fragments of stool, or opaque liguid)    A careful inspection was made as the colonoscope was withdrawn, a retroflexed view of the rectum was included; findings and interventions are described below. Appropriate photo documentation was obtained. The patient  recovered in the [facility] recovery unit.    Findings:     Terminal Ileum: Normal.    Colon: There was a small sessile 4 mm rectal polyp removed with cold forceps.  Small internal hemorrhoids.    Complications: The patient tolerated the procedure well.    Impression:  There was a small sessile 4 mm rectal polyp removed with cold forceps.  Small internal hemorrhoids.    Recommendations:  1. Await pathology.  2. If hyperplastic polyp, repeat colonoscopy in 10 years.  3. If adenomatous polyps, repeat colonoscopy in 5 years.    Electronically signed: Nelda Bucks, MD    Note created: 09/21/2015  Cell: [phone]  Office Phone # [phone]

## 2015-09-22 LAB — SURGICAL PATHOLOGY

## 2015-09-24 LAB — HM COLONOSCOPY

## 2015-09-27 ENCOUNTER — Other Ambulatory Visit: Payer: Self-pay | Admitting: Primary Care

## 2015-09-27 ENCOUNTER — Encounter: Payer: Self-pay | Admitting: Primary Care

## 2015-12-15 ENCOUNTER — Other Ambulatory Visit: Payer: Self-pay | Admitting: Primary Care

## 2015-12-15 MED ORDER — AMPHETAMINE-DEXTROAMPHETAMINE 15 MG PO CP24 *I*
15.0000 mg | ORAL_CAPSULE | Freq: Every morning | ORAL | 0 refills | Status: DC
Start: 2015-12-15 — End: 2016-03-24

## 2015-12-15 NOTE — Telephone Encounter (Signed)
istop done

## 2015-12-16 ENCOUNTER — Ambulatory Visit
Admission: RE | Admit: 2015-12-16 | Discharge: 2015-12-16 | Disposition: A | Discharge status: Home / Self Care | Payer: Self-pay | Source: Ambulatory Visit | Attending: Obstetrics and Gynecology | Admitting: Obstetrics and Gynecology

## 2015-12-17 LAB — VAGINITIS SCREEN: DNA PROBE: Vaginitis Screen:DNA Probe: 0

## 2016-01-28 ENCOUNTER — Other Ambulatory Visit: Payer: Self-pay | Admitting: Primary Care

## 2016-01-28 MED ORDER — FLUOXETINE HCL 20 MG PO CAPS *I*
60.0000 mg | ORAL_CAPSULE | Freq: Every day | ORAL | 3 refills | Status: DC
Start: 2016-01-28 — End: 2016-09-25

## 2016-03-23 ENCOUNTER — Other Ambulatory Visit: Payer: Self-pay | Admitting: Primary Care

## 2016-03-23 DIAGNOSIS — Z0000 Encounter for general adult medical examination without abnormal findings: Secondary | ICD-10-CM

## 2016-03-24 ENCOUNTER — Other Ambulatory Visit: Payer: Self-pay | Admitting: Primary Care

## 2016-03-24 MED ORDER — AMPHETAMINE-DEXTROAMPHETAMINE 15 MG PO CP24 *I*
15.0000 mg | ORAL_CAPSULE | Freq: Every morning | ORAL | 0 refills | Status: DC
Start: 2016-03-24 — End: 2016-06-15

## 2016-04-19 ENCOUNTER — Encounter: Payer: Self-pay | Admitting: Primary Care

## 2016-05-11 ENCOUNTER — Other Ambulatory Visit
Admission: RE | Admit: 2016-05-11 | Discharge: 2016-05-11 | Disposition: A | Discharge status: Home / Self Care | Payer: Self-pay | Source: Ambulatory Visit | Attending: Primary Care | Admitting: Primary Care

## 2016-05-11 DIAGNOSIS — Z0000 Encounter for general adult medical examination without abnormal findings: Secondary | ICD-10-CM

## 2016-05-11 LAB — COMPREHENSIVE METABOLIC PANEL
ALT: 21 U/L (ref 0–35)
AST: 30 U/L (ref 0–35)
Albumin: 4.3 g/dL (ref 3.5–5.2)
Alk Phos: 86 U/L (ref 35–105)
Anion Gap: 14 (ref 7–16)
Bilirubin,Total: 0.5 mg/dL (ref 0.0–1.2)
CO2: 26 mmol/L (ref 20–28)
Calcium: 8.9 mg/dL (ref 8.6–10.2)
Chloride: 100 mmol/L (ref 96–108)
Creatinine: 0.82 mg/dL (ref 0.51–0.95)
GFR,Black: 88 *
GFR,Caucasian: 76 *
Glucose: 87 mg/dL (ref 60–99)
Lab: 10 mg/dL (ref 6–20)
Potassium: 4.8 mmol/L (ref 3.3–5.1)
Sodium: 140 mmol/L (ref 133–145)
Total Protein: 6.5 g/dL (ref 6.3–7.7)

## 2016-05-11 LAB — LIPID PANEL
Chol/HDL Ratio: 1.7
Cholesterol: 193 mg/dL
HDL: 113 mg/dL
LDL Calculated: 65 mg/dL
Non HDL Cholesterol: 80 mg/dL
Triglycerides: 73 mg/dL

## 2016-05-11 LAB — CBC AND DIFFERENTIAL
Baso # K/uL: 0.1 10*3/uL (ref 0.0–0.1)
Basophil %: 1.1 %
Eos # K/uL: 0.3 10*3/uL (ref 0.0–0.4)
Eosinophil %: 4.5 %
Hematocrit: 43 % (ref 34–45)
Hemoglobin: 14.1 g/dL (ref 11.2–15.7)
IMM Granulocytes #: 0 10*3/uL (ref 0.0–0.1)
IMM Granulocytes: 0.7 %
Lymph # K/uL: 0.8 10*3/uL — ABNORMAL LOW (ref 1.2–3.7)
Lymphocyte %: 13.5 %
MCH: 31 pg/cell (ref 26–32)
MCHC: 33 g/dL (ref 32–36)
MCV: 93 fL (ref 79–95)
Mono # K/uL: 0.5 10*3/uL (ref 0.2–0.9)
Monocyte %: 8.2 %
Neut # K/uL: 4 10*3/uL (ref 1.6–6.1)
Nucl RBC # K/uL: 0 10*3/uL (ref 0.0–0.0)
Nucl RBC %: 0 /100 WBC (ref 0.0–0.2)
Platelets: 229 10*3/uL (ref 160–370)
RBC: 4.6 MIL/uL (ref 3.9–5.2)
RDW: 13.1 % (ref 11.7–14.4)
Seg Neut %: 72 %
WBC: 5.6 10*3/uL (ref 4.0–10.0)

## 2016-05-11 LAB — HEMOGLOBIN A1C: Hemoglobin A1C: 5.4 % (ref 4.0–6.0)

## 2016-05-11 LAB — TSH: TSH: 0.98 u[IU]/mL (ref 0.27–4.20)

## 2016-05-11 LAB — URINALYSIS WITH REFLEX TO MICROSCOPIC
Blood,UA: NEGATIVE
Ketones, UA: NEGATIVE
Leuk Esterase,UA: NEGATIVE
Nitrite,UA: NEGATIVE
Protein,UA: NEGATIVE mg/dL
Specific Gravity,UA: 1.019 (ref 1.002–1.030)
pH,UA: 7 (ref 5.0–8.0)

## 2016-05-15 LAB — VITAMIN D
25-OH VIT D2: <4 ng/mL
25-OH VIT D3: 42 ng/mL
25-OH Vit Total: 42 ng/mL (ref 30–60)

## 2016-05-22 ENCOUNTER — Ambulatory Visit: Payer: Self-pay | Admitting: Primary Care

## 2016-05-22 ENCOUNTER — Encounter: Payer: Self-pay | Admitting: Primary Care

## 2016-05-22 VITALS — BP 120/70 | HR 76 | Temp 99.5°F | Ht 63.25 in | Wt 117.2 lb

## 2016-05-22 DIAGNOSIS — Z0000 Encounter for general adult medical examination without abnormal findings: Secondary | ICD-10-CM

## 2016-05-22 DIAGNOSIS — F32A Depression, unspecified: Secondary | ICD-10-CM

## 2016-05-22 NOTE — H&P (Signed)
Jackie Moran is a 65 y.o. female Comes in for complete physical exam.  She is doing very well.  She is engaged to be married to a childhood friend Jackie Moran.  This has made her very happy.  His been an excellent relationship.  Her depression is doing very well and she remains on only Prozac.  For fatigue takes Adderall 15 mg daily which helps.    Continues on a gluten-free diet.    She's had no recurrent episodes of hemoptysis since October 2016.    She is exercising on a daily basis.  She is sleeping okay.  Her weight is stable.    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    Ganglion cyst of left foot M67.472    Left foot pain M79.672    Tendinitis of foot M77.50    PVD (posterior vitreous detachment), left H43.812         Allergies:  Egg; Gluten meal; Milk; Amoxicillin; Doxycycline hyclate; Penicillins; Environmental allergies; Food; and No known latex allergy       Medications:  Current Outpatient Prescriptions on File Prior to Visit   Medication Sig Dispense Refill    amphetamine-dextroamphetamine (ADDERALL XR) 15 MG 24 hr capsule Take 1 capsule (15 mg total) by mouth every morning   Max daily dose: 15 mg Code B 90 capsule 0    FLUoxetine (PROZAC) 20 MG capsule Take 3 capsules (60 mg total) by mouth daily 270 capsule 3    polyvinyl alcohol (LIQUIFILM TEARS) 1.4 % ophthalmic solution Place 1 drop into both eyes as needed for Dry Eyes      estrogen conjugated-medroxyPROGESTERone (PREMPRO) 0.3-1.5 MG per tablet Take 1 tablet by mouth daily      Ascorbic Acid (VITAMIN C PO) Take by mouth      Probiotic Product (PROBIOTIC DAILY PO) Take by mouth      Calcium 500 MG tablet 2 times daily       Cholecalciferol (VITAMIN D) 1000 UNIT tablet TAKE 1 TABLET DAILY. 1 0    Multiple Vitamin (MULTIVITAMIN) capsule TAKE 1 CAPSULE DAILY.  0     No current facility-administered medications on file prior to visit.      Medications reviewed, reconciled with patients and changes were made          Immunizations:  Immunization History   Administered Date(s) Administered    DTaP 12/02/2015    Td 08/28/1986    Tdap 12/06/2006, 12/02/2015         Social History:  Social History     Social History    Marital status: Divorced     Spouse name: N/A    Number of children: N/A    Years of education: N/A     Occupational History    Not on file.     Social History Main Topics    Smoking status: Former Smoker     Packs/day: 1.00     Years: 10.00     Quit date: 11/15/1972    Smokeless tobacco: Not on file    Alcohol use No    Drug use:  No    Sexual activity: Not Currently     Social History Narrative    Family Hx      Father age 24 has asthma and has had an MI. Bikes, myelodysplastic syndrome-requires transfusion every 2 weeks    Mother age 42 has died. She had a degenerative neurologic disease     Brother age 62 has hyperlipidemia, Asthma    Brother died age 37 leukemia            Personal Hx     Divorced for 27 years. She's had no children.  Discussed she is in a new relationship.    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 No fever, chills, or sweats. No fatigue or pain.   Skin: No sores or rashes. No changes in hair or nails.   Bleeding/immunity: No enlarged  glands. No abnormal bleeding.   Endocrine: No heat or cold intolerance. No excessive thirst or urination.   Musculoskeletal: No muscle weakness or pain. No joint swelling or pain.   EENT:  No loss of hearing or ringing in ears. No sore throat or hoarseness.   Head and neck: No headache, dizziness, or lightheadedness.   Heart and lung: No chest pain, SOB, palpitations. No wheezing, cough, or sputum. No leg swelling or pain with walking.   GI: No trouble swallowing or heartburn. No abdominal pain. No nausea or vomiting. No constipation or diarrhea.   Urinary: No increased frequency, burning, urgency, or incontinence. No blood in urine.   Neuropsych: No numbness or tingling. No loss of balance, or trouble walking. No seizures. No depression, anxiety, or suicidal thoughts. No poor judgment, memory, or attention.No fainting or loss of consciousness.       Physical Exam:  Vitals:    05/22/16 1310   BP: 120/70   Pulse: 76   Temp: 37.5 C (99.5 F)   Weight: 53.2 kg (117 lb 3.2 oz)   Height: 1.607 m (5' 3.25")     Body mass index is 20.6 kg/(m^2).   SpO2 Readings from Last 3 Encounters:   05/22/16 99%   09/09/15 99%   08/26/15 96%     Exam:  GENERAL:  Alert and oriented; no acute distress  SKIN: Normal, no  lesions consistent with skin cancer or melanoma  HEAD:  normal  EYES:   --lids:  normal  --conjunctiva:  normal  --pupils:  normal  --sclera:  normal  --EOM:  normal  EARS, NOSE, THROAT:  --ext. ear:  normal  --T.M.'s:  normal  --nares:  normal  --teeth:  normal  --gums:  normal  --tonsils:  normal  --throat:  normal  NECK:  --ROM:  normal  --nodes:  negative  --carotids: normal, JVP flat  --bruits:  none heard   --thyroid:  normal, no nodules  HEART:  RRR, S1S2, no m/g/r  LUNGS:  clear bilaterally, no  rales, rhonchi, crackles  CHEST:  normal  BREASTS:    --skin:  normal  --masses:  none  --nipples:  normal  --discharge:  none  --nodes:  none  BACK:  normal, no kyphosis or scoliosis  LYMPH: No cervical, clavicular,  axilla, or inguinal adenopathy  ABDOMEN:    --softness:  normal  --tenderness:  non-tender  --distension:  none  --organs:  no HSM  --bruits:  none  --pulsatile mass:  none  RECTAL:  Not indicated  GENITALIA: deferred to GYN  EXTREMITIES:  --edema:  none  --pulses:  normal, dorsalis pedis posterior tibial pulses 2+ bilaterally  --joints:  normal  --nails:   normal    NEUROLOGIC:  Nonfocal exam, no abnormalities              Labs:  Recent Results (from the past 336 hour(s))   Lipid add Rfx to Drt LDL if Trig >400    Collection Time: 05/11/16  9:10 AM   Result Value Ref Range    Cholesterol 193 mg/dL    Triglycerides 73 mg/dL    HDL 113 mg/dL    LDL Calculated 65 mg/dL    Non HDL Cholesterol 80 mg/dL    Chol/HDL Ratio 1.7    Comprehensive metabolic panel    Collection Time: 05/11/16  9:10 AM   Result Value Ref Range    Sodium 140 133 - 145 mmol/L    Potassium 4.8 3.3 - 5.1 mmol/L    Chloride 100 96 - 108 mmol/L    CO2 26 20 - 28 mmol/L    Anion Gap 14 7 - 16    UN 10 6 - 20 mg/dL    Creatinine 0.82 0.51 - 0.95 mg/dL    GFR,Caucasian 76 *    GFR,Black 88 *    Glucose 87 60 - 99 mg/dL    Calcium 8.9 8.6 - 10.2 mg/dL    Total Protein 6.5 6.3 - 7.7 g/dL    Albumin 4.3 3.5 - 5.2 g/dL    Bilirubin,Total 0.5 0.0 - 1.2 mg/dL    AST 30 0 - 35 U/L    ALT 21 0 - 35 U/L    Alk Phos 86 35 - 105 U/L   CBC and differential    Collection Time: 05/11/16  9:10 AM   Result Value Ref Range    WBC 5.6 4.0 - 10.0 THOU/uL    RBC 4.6 3.9 - 5.2 MIL/uL    Hemoglobin 14.1 11.2 - 15.7 g/dL    Hematocrit 43 34 - 45 %    MCV 93 79 - 95 fL    MCH 31 26 - 32 pg/cell    MCHC 33 32 - 36 g/dL    RDW 13.1 11.7 - 14.4 %    Platelets 229 160 - 370 THOU/uL    Seg Neut % 72.0 %    Lymphocyte % 13.5 %    Monocyte % 8.2 %    Eosinophil % 4.5 %    Basophil % 1.1 %    Neut # K/uL 4.0 1.6 - 6.1 THOU/uL    Lymph # K/uL 0.8 (L) 1.2 - 3.7 THOU/uL    Mono # K/uL 0.5 0.2 - 0.9 THOU/uL    Eos # K/uL 0.3 0.0 - 0.4 THOU/uL    Baso # K/uL 0.1 0.0 - 0.1 THOU/uL    Nucl  RBC % 0.0 0.0 - 0.2 /100 WBC    Nucl RBC # K/uL 0.0 0.0 - 0.0 THOU/uL  IMM Granulocytes # 0.0 0.0 - 0.1 THOU/uL    IMM Granulocytes 0.7 %   Urinalysis with reflex to microscopic    Collection Time: 05/11/16  9:10 AM   Result Value Ref Range    Color, UA Yellow Yellow    Appearance,UR 2+ Cloudy (!) Clear    Specific Gravity,UA 1.019 1.002 - 1.030    Leuk Esterase,UA NEG NEGATIVE    Nitrite,UA NEG NEGATIVE    pH,UA 7.0 5.0 - 8.0    Protein,UA NEG NEGATIVE mg/dL    Glucose,UA NORM mg/dL    Ketones, UA NEG NEGATIVE    Blood,UA NEG NEGATIVE   TSH    Collection Time: 05/11/16  9:10 AM   Result Value Ref Range    TSH 0.98 0.27 - 4.20 uIU/mL   Hemoglobin A1c    Collection Time: 05/11/16  9:10 AM   Result Value Ref Range    Hemoglobin A1C 5.4 4.0 - 6.0 %   Vitamin D    Collection Time: 05/11/16  9:10 AM   Result Value Ref Range    25-OH VIT D2 <4 ng/mL    25-OH VIT D3 42 ng/mL    25-OH Vit Total 42 30 - 60 ng/mL              1. Routine general medical examination at a health care facility     2. Depression, unspecified depression type       Assessment and Plan  71: 65 year old woman doing well.  2: She is now in a relationship and engaged and is very happy.  Depression is much improved.  Continue on Prozac and Adderall unchanged  3: Labs are all excellent  4: Up-to-date with colonoscopy and mammogram Pap smear  5: Declines all immunizations    Follow-up: One year    Counseling given:   --Immunizations- declines all   --Colon CA screening  Discussed up to date;    --Diabetes screening tests discussed up to date;     --Mammogram screening discussed up to date;   -- Osteoporosis prevention and screening discussed;  DEXA  up to date;    --Cardiac risk factor modification and lipid tests discussed; up to date;   --Alcohol/drug use discussed  --Exercise discussed

## 2016-06-15 ENCOUNTER — Other Ambulatory Visit: Payer: Self-pay | Admitting: Primary Care

## 2016-06-15 MED ORDER — AMPHETAMINE-DEXTROAMPHETAMINE 15 MG PO CP24 *I*
15.0000 mg | ORAL_CAPSULE | Freq: Every morning | ORAL | 0 refills | Status: DC
Start: 2016-06-15 — End: 2016-09-27

## 2016-09-25 ENCOUNTER — Other Ambulatory Visit: Payer: Self-pay | Admitting: Primary Care

## 2016-09-25 MED ORDER — FLUOXETINE HCL 20 MG PO CAPS *I*
60.0000 mg | ORAL_CAPSULE | Freq: Every day | ORAL | 3 refills | Status: DC
Start: 2016-09-25 — End: 2016-09-26

## 2016-09-26 ENCOUNTER — Other Ambulatory Visit: Payer: Self-pay | Admitting: Primary Care

## 2016-09-26 MED ORDER — FLUOXETINE HCL 20 MG PO CAPS *I*
60.0000 mg | ORAL_CAPSULE | Freq: Every day | ORAL | 3 refills | Status: AC
Start: 2016-09-26 — End: ?

## 2016-09-27 ENCOUNTER — Other Ambulatory Visit: Payer: Self-pay | Admitting: Primary Care

## 2016-09-27 MED ORDER — AMPHETAMINE-DEXTROAMPHETAMINE 15 MG PO CP24 *I*
15.0000 mg | ORAL_CAPSULE | Freq: Every morning | ORAL | 0 refills | Status: AC
Start: 2016-09-27 — End: ?

## 2016-11-07 ENCOUNTER — Telehealth: Payer: Self-pay | Admitting: Primary Care

## 2016-11-07 NOTE — Telephone Encounter (Signed)
No, I do not feel comfortable treating by phone given all her sx.  I would suggest getting checked at an urgent care, may need a chest xray

## 2016-11-07 NOTE — Telephone Encounter (Signed)
Patient states she has headaches, achy, coughing up green phlegm, ear pain, sore throat, fatigued, no fever.  [place] and would like to know if you can send a script down to:   [facility]arm[facility]: [facility] [id number] - [place], [place] - [address] [facility] [address][Patient Preferred][phone].  Pls notify patient.

## 2016-11-07 NOTE — Telephone Encounter (Signed)
Notified patient.

## 2017-02-22 IMAGING — MG MAMMOGRAPHY SCREENING BILATERAL 3D TOMOSYNTHESIS WITH CAD
12 series · 12 of 28 positions shown · non-contrast
Comparison: None.

MAMMOGRAPHY SCREENING BILATERAL 3D TOMOSYNTHESIS WITH CAD, 02/22/2017 [DATE]: 
CLINICAL INDICATION: Screening.
TECHNIQUE: Digital bilateral mammograms and 3-D Tomosynthesis were obtained. 
These were interpreted both primarily and with the aid of computer-aided 
detection system.

[L CC synth-2D]
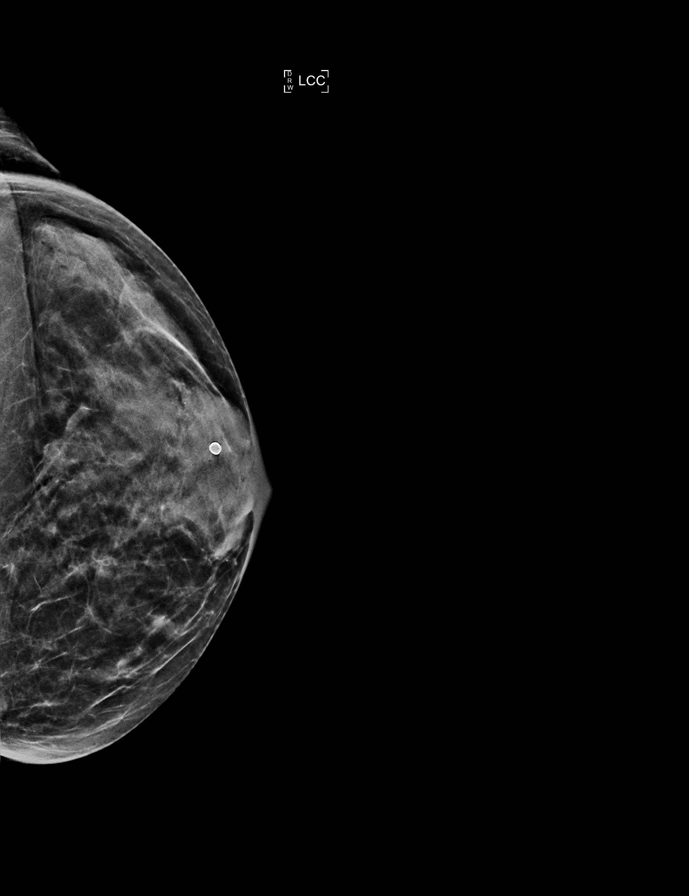

[R MLO synth-2D]
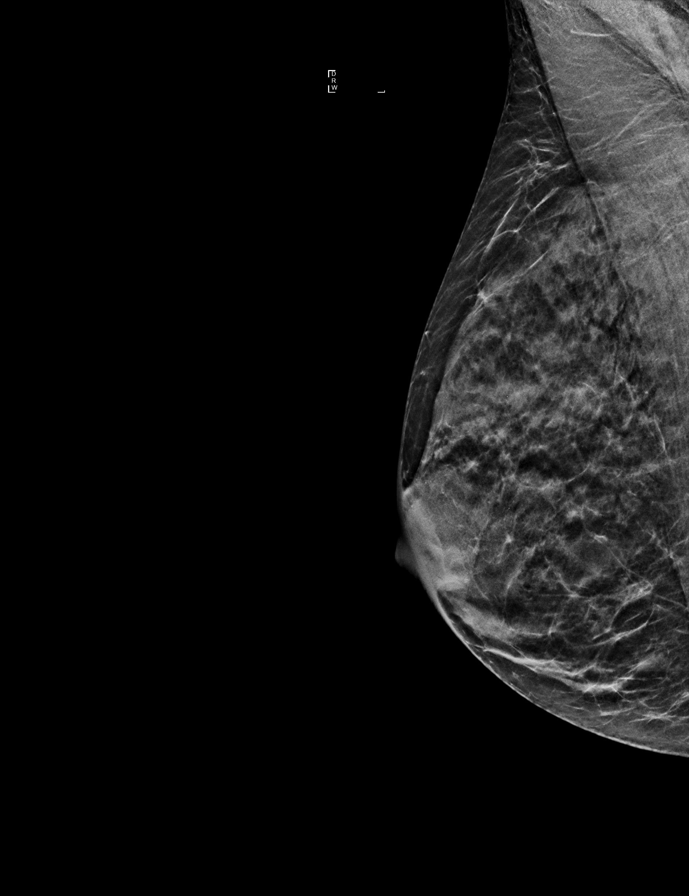

[R CC synth-2D]
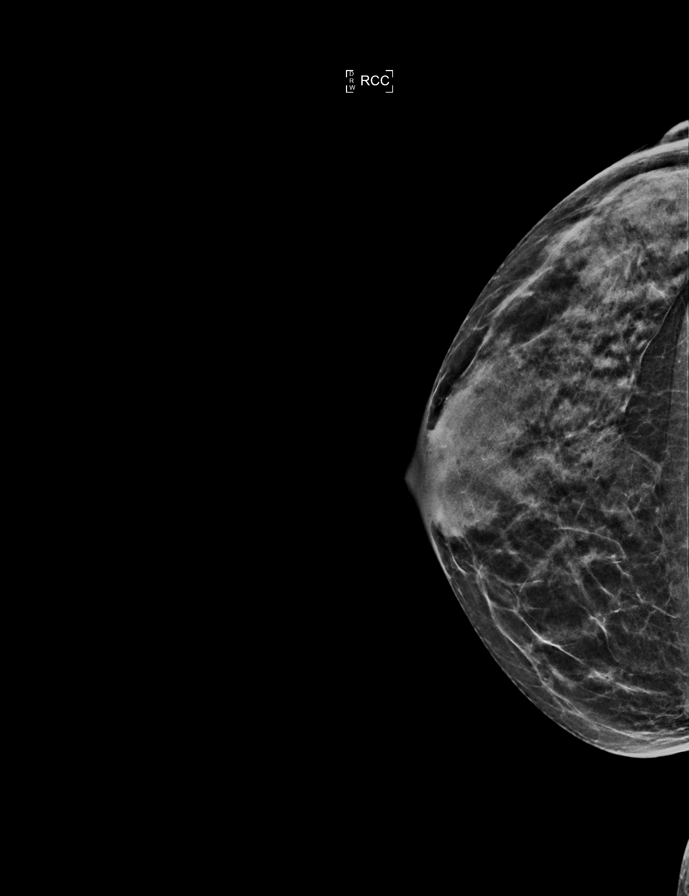

[L MLO synth-2D]
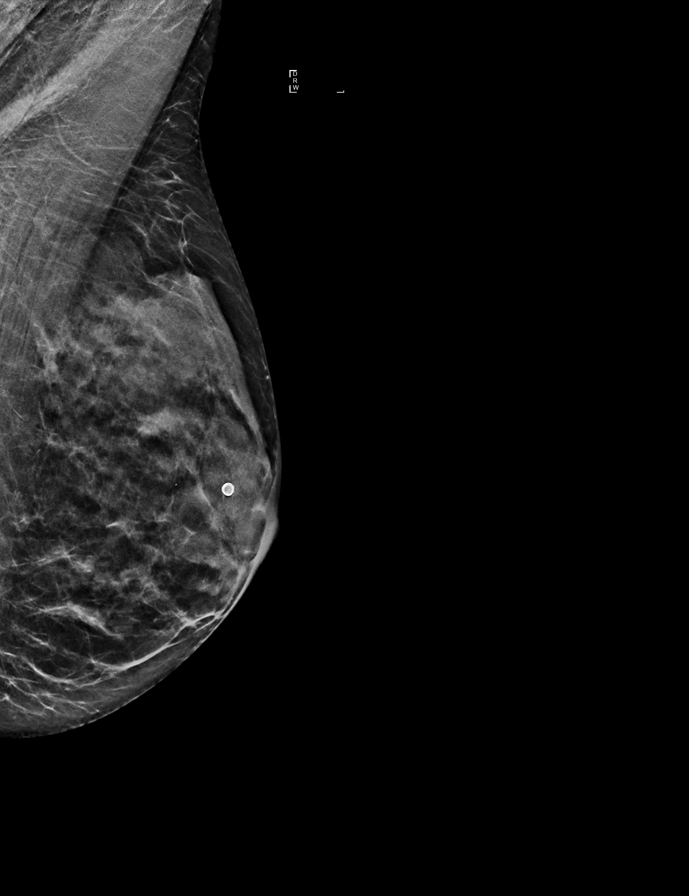

[L CC tomo (1 of 2)]
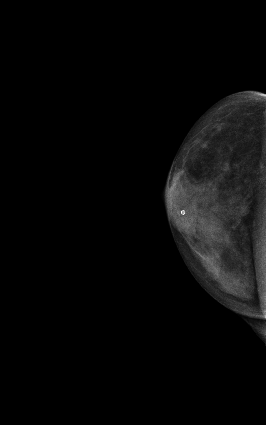

[R CC tomo (1 of 2)]
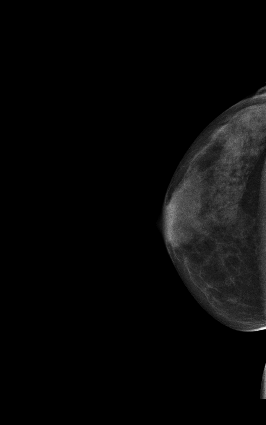

[L MLO tomo (1 of 2)]
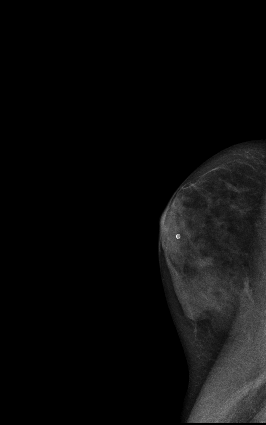

[R MLO tomo (1 of 2)]
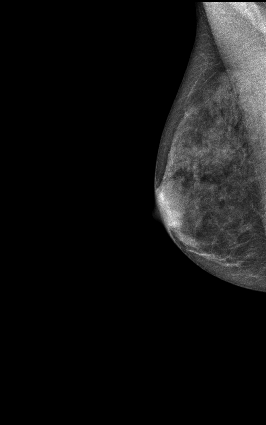

[L CC tomo (2 of 2) · tomo slice 19/37.0]
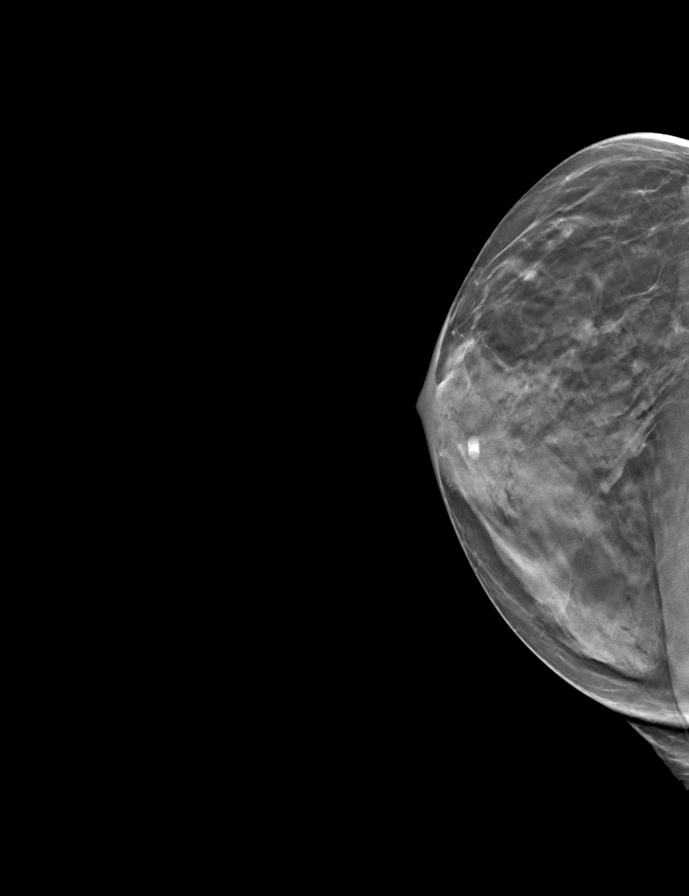

[R MLO tomo (2 of 2) · tomo slice 17/34.0]
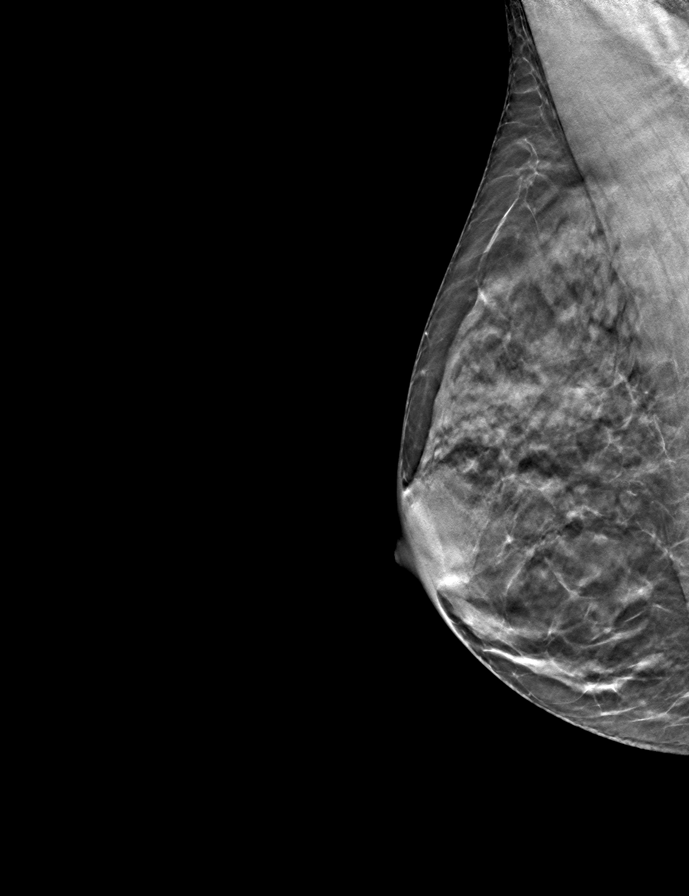

[R CC tomo (2 of 2) · tomo slice 19/36.0]
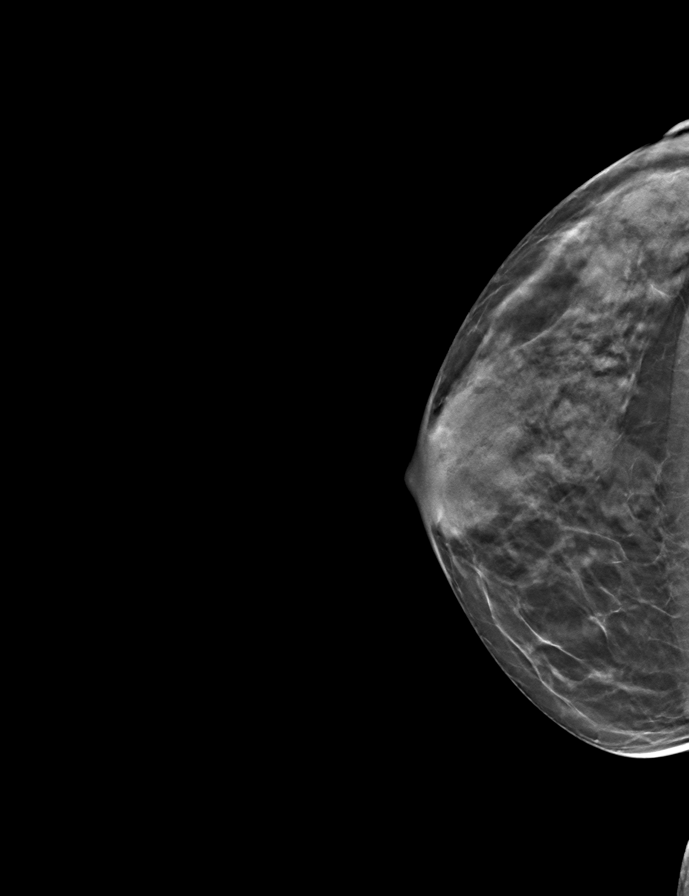

[L MLO tomo (2 of 2) · tomo slice 19/37.0]
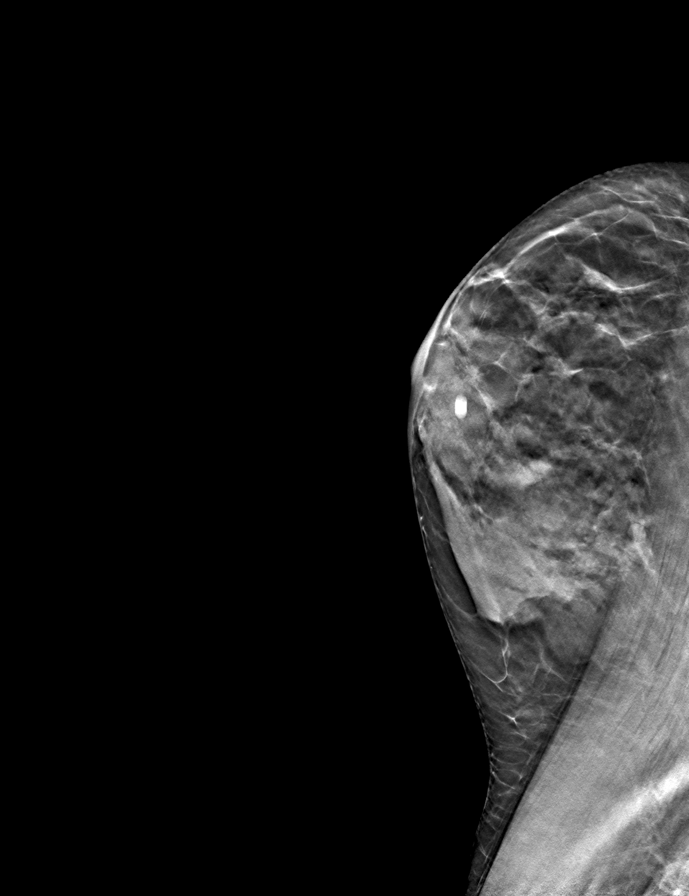

[12 of 28 positions shown; findings below may reference images not displayed]

Patient brought in an optical disc of outside facility 
mammograms however these will not load onto our viewing system and cannot be 
used for comparison. 
BREAST DENSITY: (Level C) The breasts are heterogeneously dense, which may 
obscure small masses.
FINDINGS: There are no suspicious findings.
IMPRESSION: ( BI-RADS 2) Benign findings. Routine mammographic follow-up is recommended.

## 2017-05-23 ENCOUNTER — Encounter: Payer: Self-pay | Admitting: Primary Care

## 2019-05-06 IMAGING — CT CT CHEST WITH CONTRAST
2 of 3 series · 14 of 36 positions shown, 17 images · IV contrast (ISOVUE 300)
Comparison: There are no prior exam(s) available for comparison within the 
past 12 months;

******** ADDENDUM #1 ********/n 
Comparison is made with prior CT of 08/31/2005. 
The questioned soft tissue fullness in the anterior mediastinum is shown to 
represent an unopacified left brachiocephalic vein and is unchanged. 
Stable bronchiectasis and fibroatelectasis within the right middle lobe and 
lingula atelectasis. 
The left lung nodules are stable. 
ORIGINAL REPORT ******** 
CT CHEST WITH CONTRAST, 05/06/2019 [DATE]: 
CLINICAL INDICATION: History of bronchiectasis. Subcentimeter nodule seen on 
previous outside CT report which were thought to be inflammatory. 
A search for DICOM formatted images was conducted for prior CT imaging studies 
completed at a non-affiliated media free facility.
TECHNIQUE: The chest was scanned from base of neck through the lung bases with 
044cc of Isovue 300 injected intravenously on a high resolution low dose CT 
scanner.  Routine MPR and MIP 3D renderings were reconstructed on an independent 
workstation with concurrent physician supervision.

[Series 4: chest 2.0 i31s 3 · axial · 0.66mm/px · z∈[-294,+4]mm · 11 of 177 slices shown, 14 images]
[im 14/177  mediastinal]
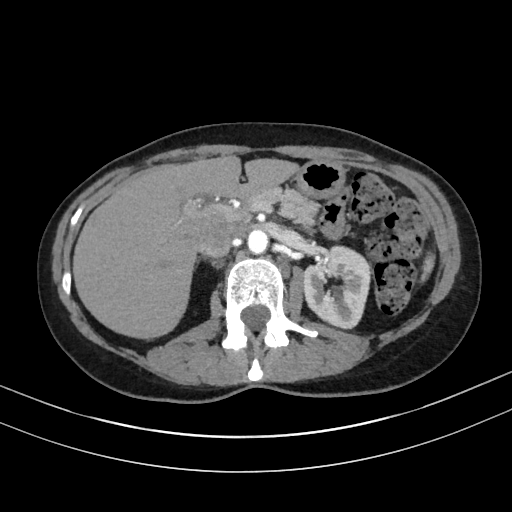
[im 14/177  lung]
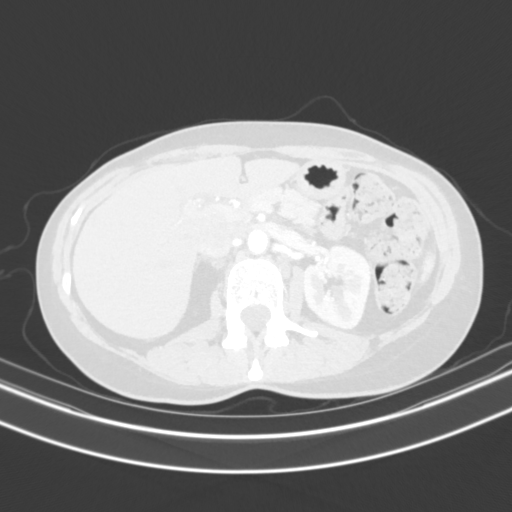
[im 27/177  lung]
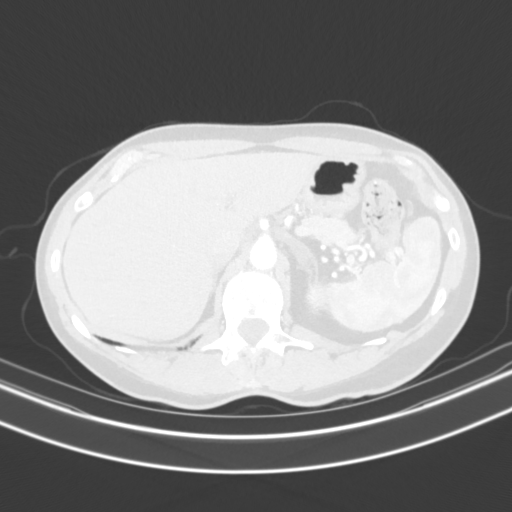
[im 40/177  lung]
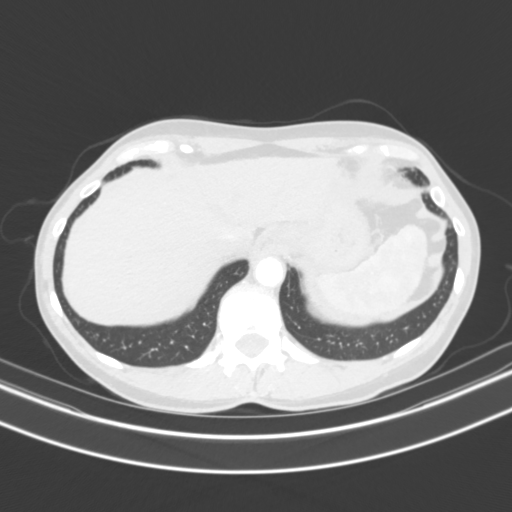
[im 59/177  lung]
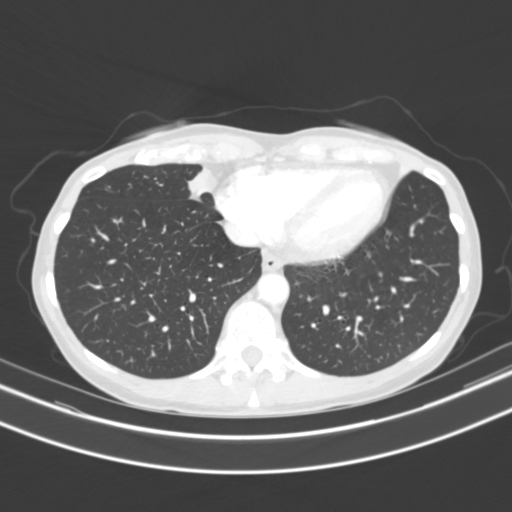
[im 72/177  mediastinal]
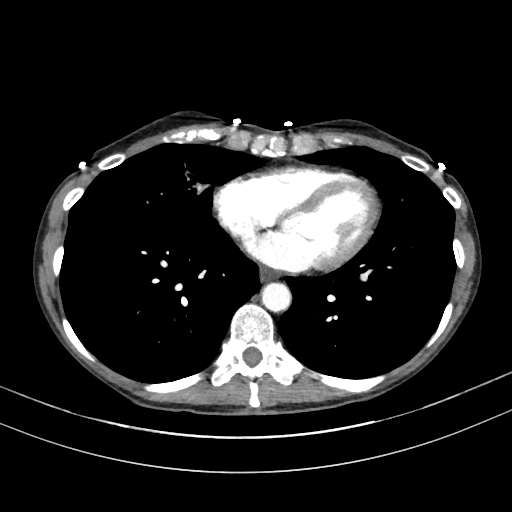
[im 72/177  lung]
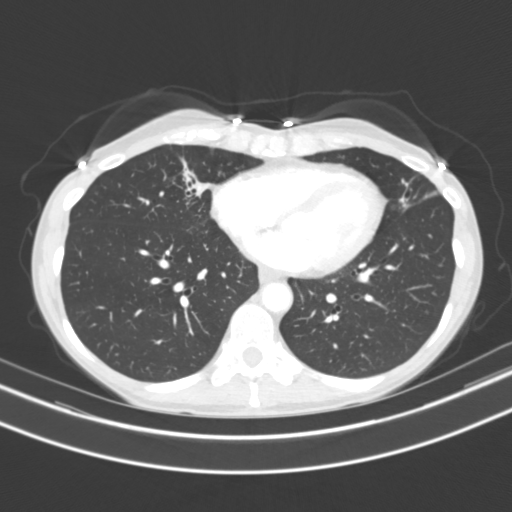
[im 92/177  lung]
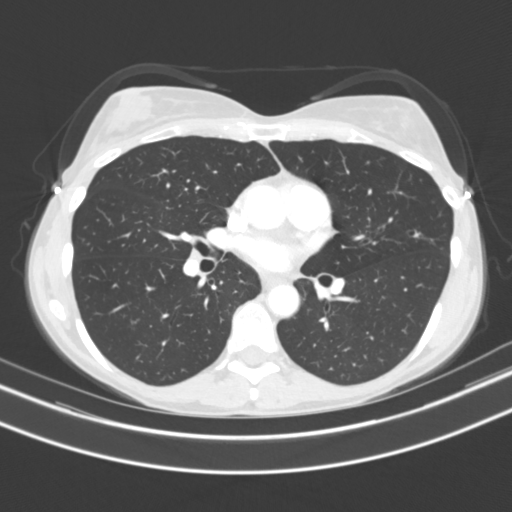
[im 105/177  lung]
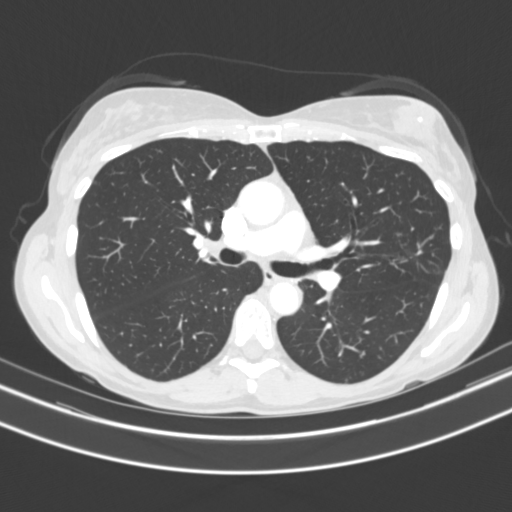
[im 118/177  lung]
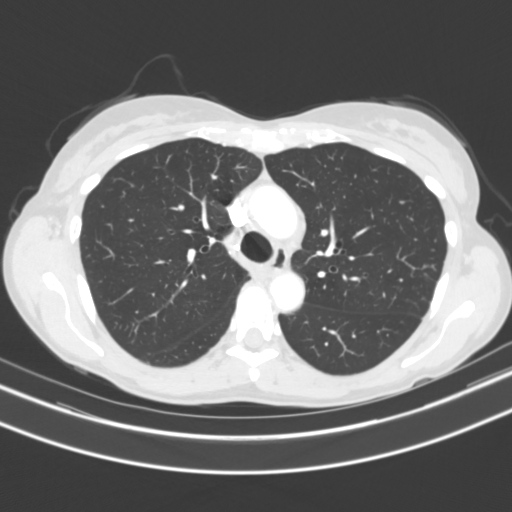
[im 137/177  mediastinal]
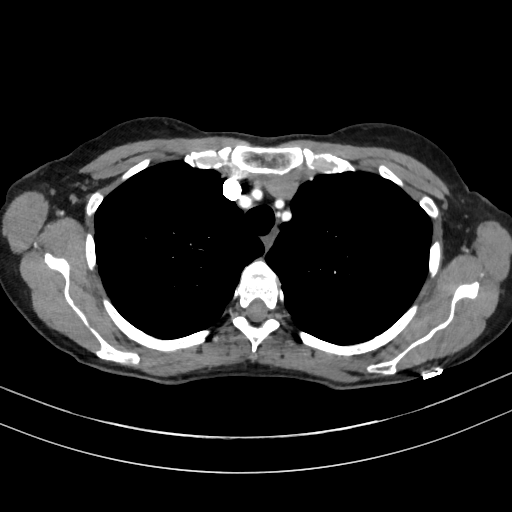
[im 137/177  lung]
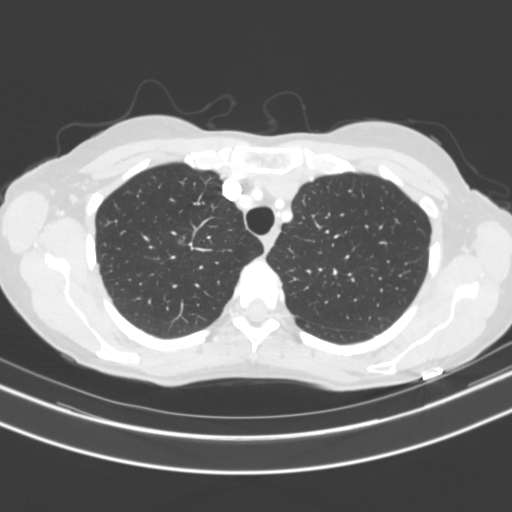
[im 150/177  lung]
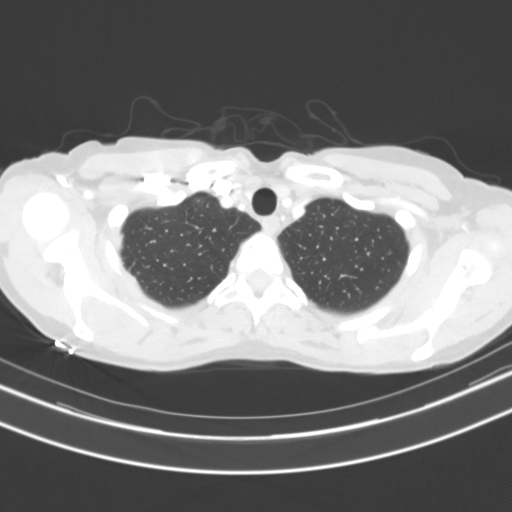
[im 163/177  lung]
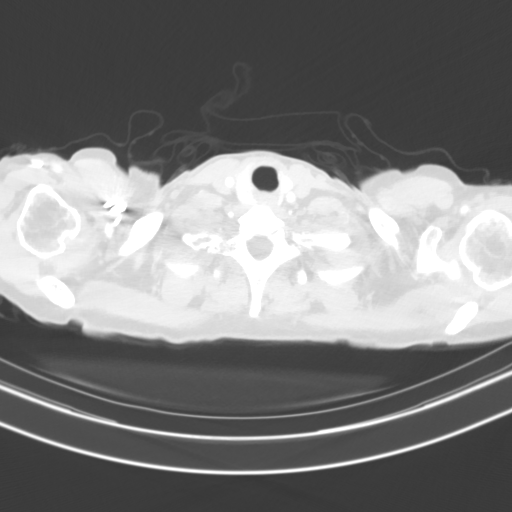

[Series 7: coronal · coronal · 0.69mm/px · 3 of 108 slices shown]
[im 22/108  lung]
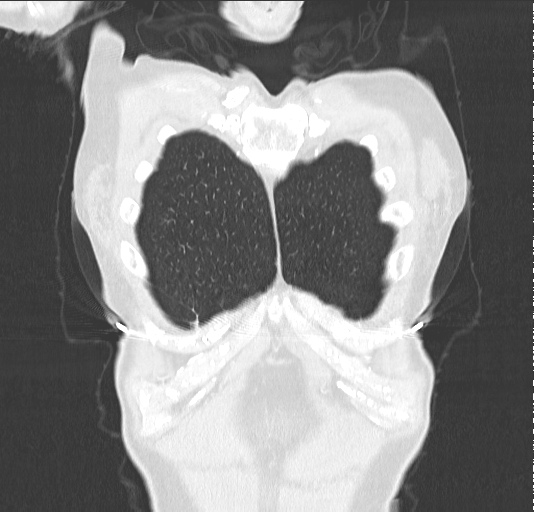
[im 43/108  lung]
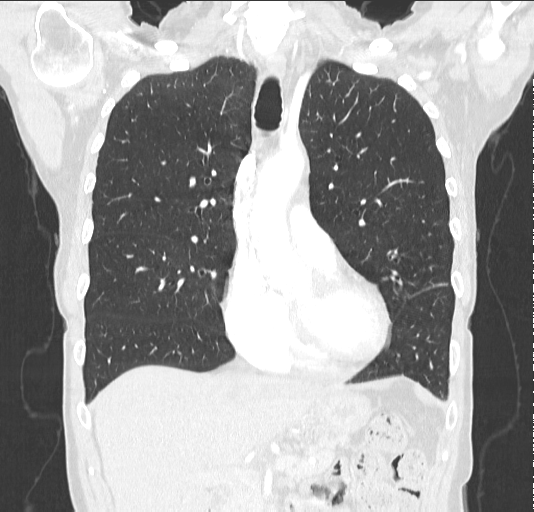
[im 65/108  lung]
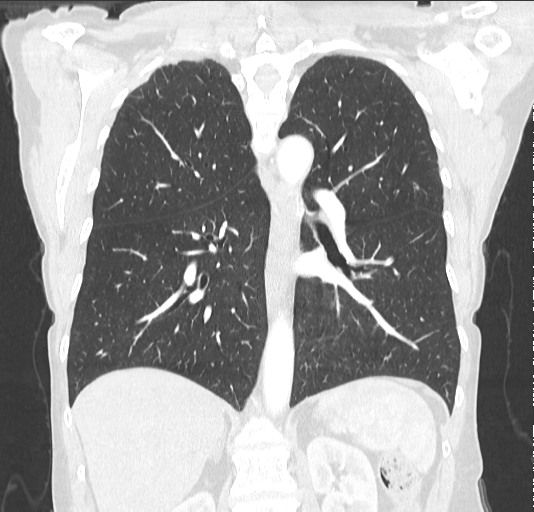

[14 of 36 positions shown; findings below may reference images not displayed]

however, comparison was made to the prior exam(s) dated 
02/26/2017 only report available at time dictation
FINDINGS: There are bronchiectatic changes with suspected adjacent scarring best 
seen in the right middle lobe as well as in the lingula, right middle lobe 
findings were described on previous CT and will attempt to acquire previous 
images to have direct comparison. There are two nodular densities with some 
possible spiculation and deforming the left major fissure seen in the left upper 
lobe as demonstrated on axial image #65 of series 5 and measuring up to 5 mm x 
4 mm and on axial image #76 measuring up to 4 mm x 6 mm. There appear to be 
smaller scattered inflammatory changes also present in the left upper lobe which 
are difficult to measure due to small size. Soft tissue fullness in the anterior 
mediastinum which has Hounsfield units of +22 and measures up to 1.5 cm x
cm. No axillary or hilar adenopathy. The heart size normal. No pulmonary embolus 
is seen. 
Limited CT of the upper abdomen demonstrates normal visualized portion of the 
liver and spleen. The adrenals are normal.
IMPRESSION: Some bronchiectatic changes and suspected scarring or inflammatory changes in 
the right middle lobe and lingula, which were described in the right middle lobe 
on previous CT report. 
 There also two nodular density with possible spiculation seen in the left upper 
lobe and some mild retraction of the adjacent major fissure, in addition there 
are some less defined scattered inflammatory changes in the left upper lobe. 
This soft tissue fullness with Hounsfield units of +22 in the anterior 
mediastinum, cannot exclude adenopathy. 
We will attempt to acquire previous CT to assess overall stability of the above 
findings and better determine if whether these findings are inflammatory in 
etiology versus neoplastic. 
RADIATION DOSE REDUCTION: All CT scans are performed using radiation dose 
reduction techniques, when applicable.  Technical factors are evaluated and 
adjusted to ensure appropriate moderation of exposure.  Automated dose 
management technology is applied to adjust the radiation doses to minimize 
exposure while achieving diagnostic quality images.

## 2019-12-12 IMAGING — MG MAMMOGRAPHY SCREENING BILATERAL 3D TOMOSYNTHESIS WITH CAD
8 series · 8 of 24 positions shown · non-contrast
Comparison: Comparison was made to prior exams.

******** ADDENDUM #1 ********/n 
Addendum: 
Prior report gave an incorrect BI-RADS designation.
TECHNIQUE: Digital bilateral mammograms and 3-D Tomosynthesis were obtained. 
These were interpreted both primarily and with the aid of computer-aided 
detection system.

[L MLO]
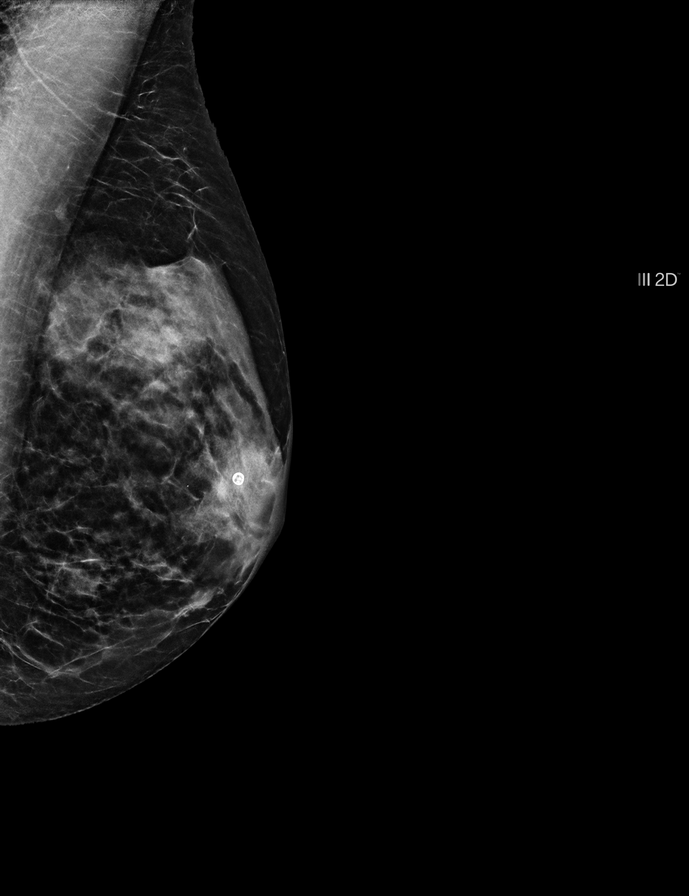

[L CC]
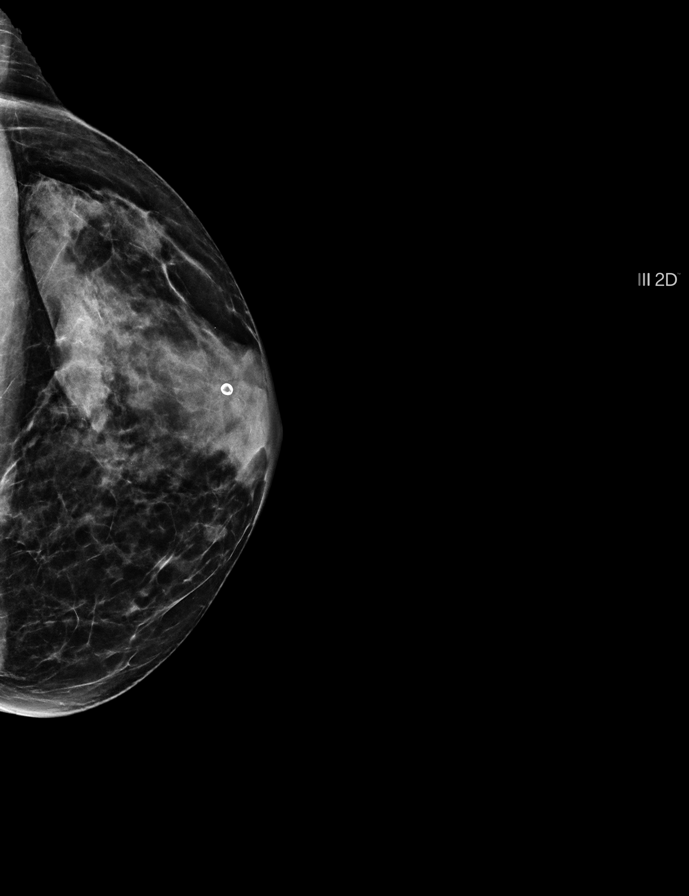

[R MLO]
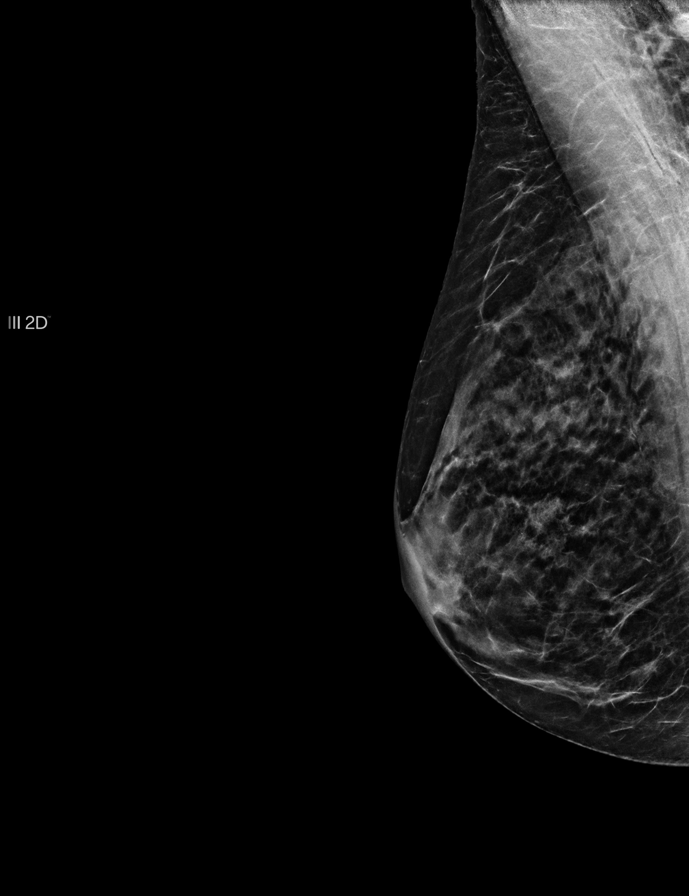

[R CC]
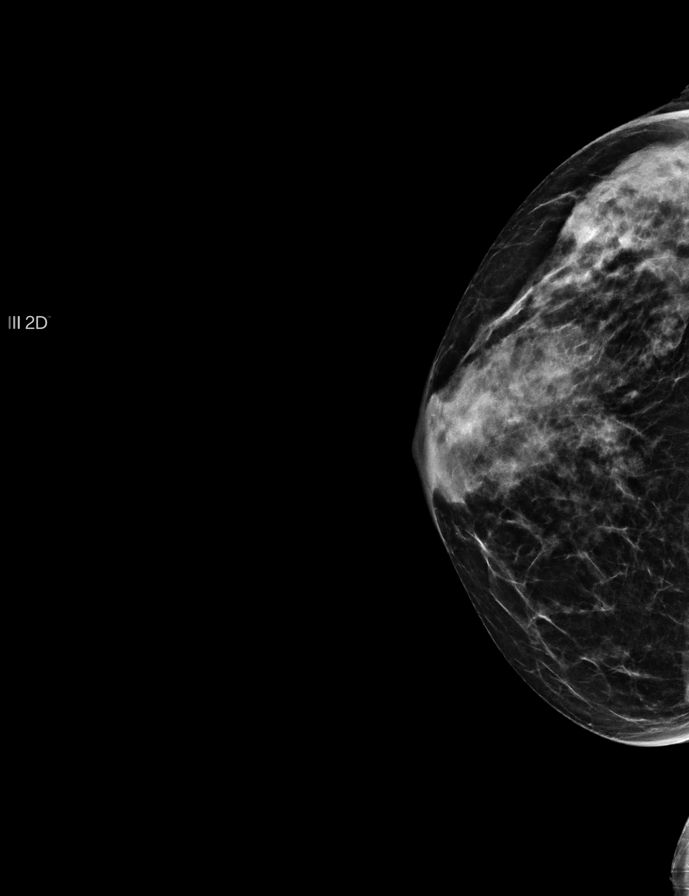

[L CC tomo · tomo slice 25/49.0]
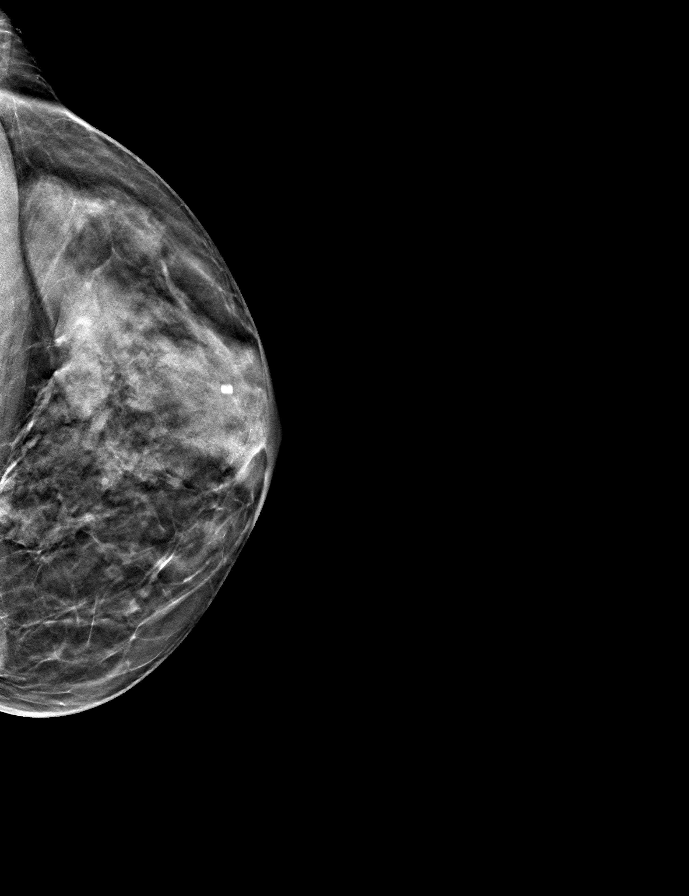

[R MLO tomo · tomo slice 23/46.0]
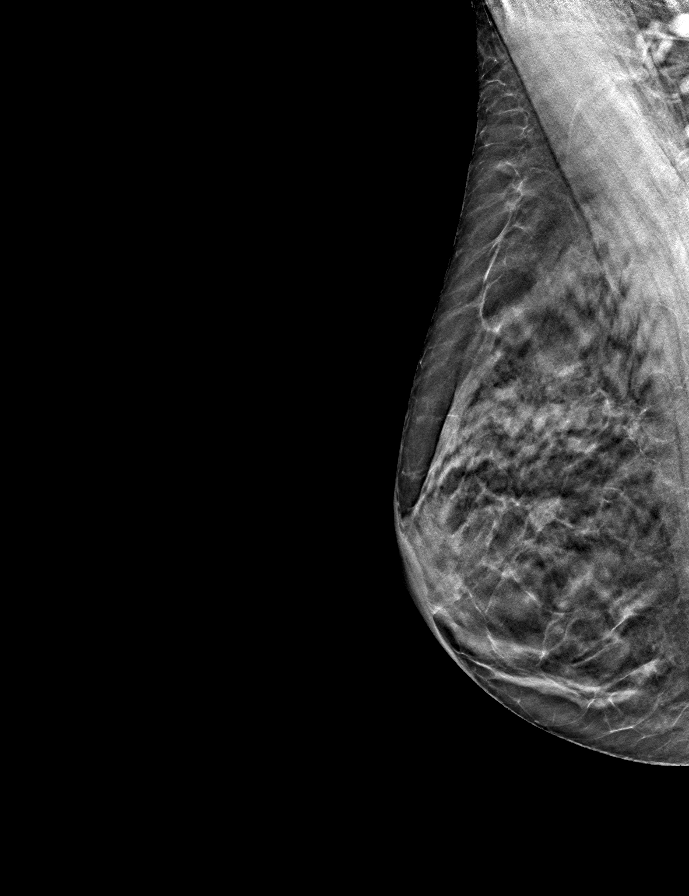

[L MLO tomo · tomo slice 23/45.0]
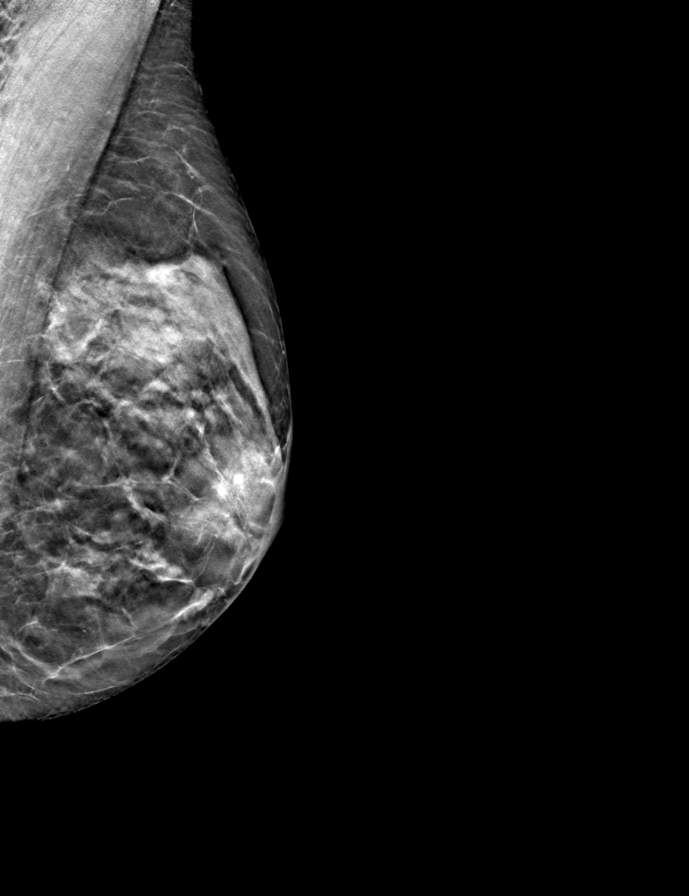

[R CC tomo · tomo slice 23/45.0]
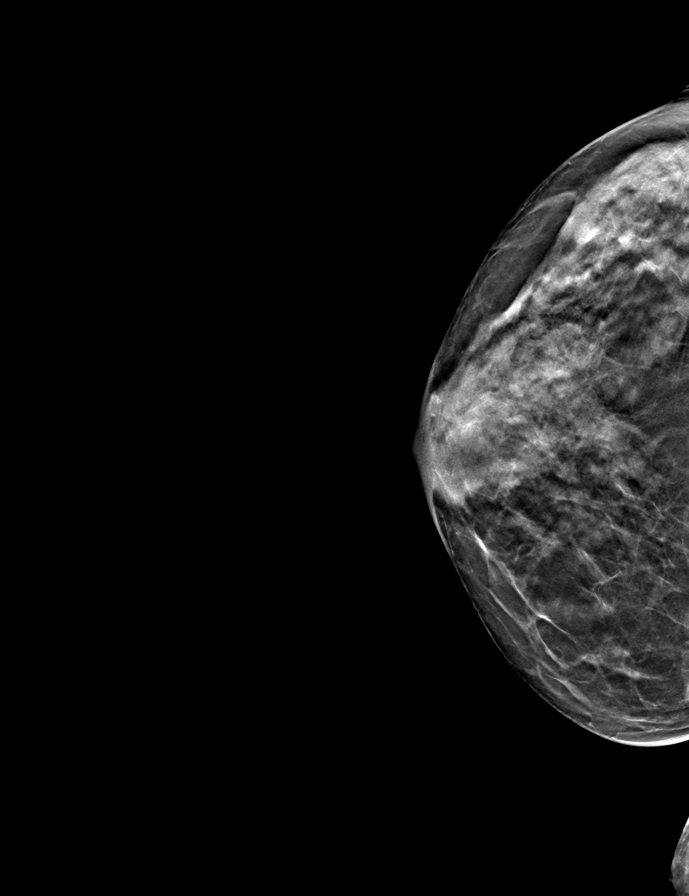

[8 of 24 positions shown; findings below may reference images not displayed]

IMPRESSION: 1.  Circumscribed lesion in the lower inner quadrant of the left breast 2 cm 
from the nipple may represent a simple cyst but has developed compared to 
priors. Further evaluation with ultrasound is necessary to rule out solid 
lesion. 
2.  No mammographic evidence of malignancy in the right breast. 
(BI-RADS 0) Incomplete. Further evaluation will be performed as discussed above, 
and the results will be reported separately. 
ORIGINAL REPORT ******** 
MAMMOGRAPHY SCREENING BILATERAL 3D TOMOSYNTHESIS WITH CAD, 12/12/2019 [DATE]: 
CLINICAL INDICATION: Screening exam.
BREAST DENSITY: (Level C) The breasts are heterogeneously dense, which may 
obscure small masses.
FINDINGS: There is a circumscribed lesion in the lower inner quadrant of the 
left breast that was not identified on prior exam. This finding may represent a 
simple cyst however new development warrants further evaluation with ultrasound. 
This is located in the lower inner quadrant 2 cm from the nipple and 
mammographically measures approximately 6 mm. 
No mammographic evidence of malignancy in the right breast.
IMPRESSION: 1.  Circumscribed lesion in the lower inner quadrant of the left breast 2 cm 
from the nipple may represent a simple cyst but has developed compared to 
priors. Further evaluation with ultrasound is necessary to rule out solid 
lesion. 
2.  No mammographic evidence of malignancy in the right breast. 
( BI-RADS 1) Negative mammogram. Routine mammographic follow-up is recommended.

## 2020-10-08 IMAGING — CT CT CHEST WITHOUT CONTRAST
2 of 3 series · 15 of 36 positions shown, 18 images · non-contrast
Comparison: Chest CT May 06, 2019 greater than one year

CT CHEST WITHOUT CONTRAST, 10/08/2020 [DATE]: 
CLINICAL INDICATION: Bronchiectasis, evaluate for progression. Shortness of 
breath, cough. 
A search for DICOM formatted images was conducted for prior CT imaging studies 
completed at a non-affiliated media free facility.
TECHNIQUE: The chest was scanned from base of neck through the lung bases 
without contrast on a high resolution low dose CT scanner.  Routine MPR and MIP 
3D renderings were reconstructed on an independent workstation with concurrent 
physician supervision.

[Series 2: chest 2.0 i31s 3 · axial · 0.74mm/px · z∈[-310,-16]mm · 12 of 173 slices shown, 15 images]
[im 13/173  mediastinal]
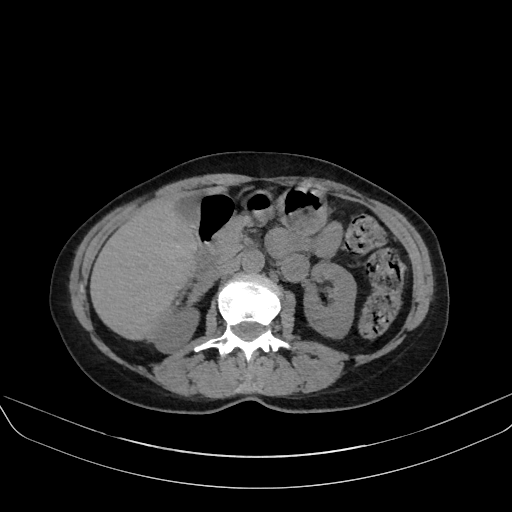
[im 13/173  lung]
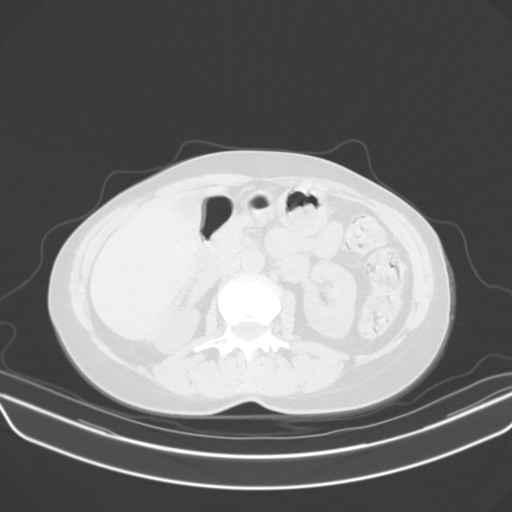
[im 26/173  lung]
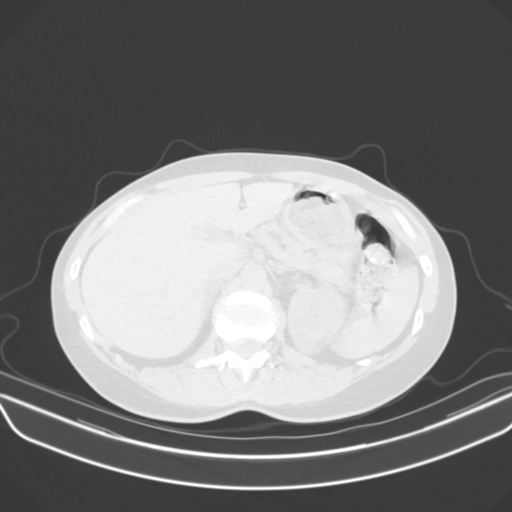
[im 39/173  lung]
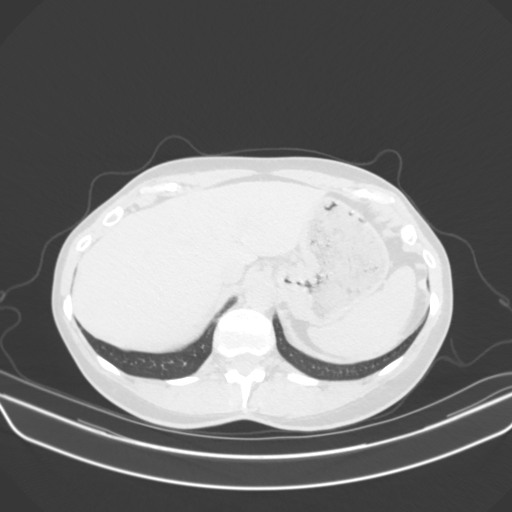
[im 51/173  lung]
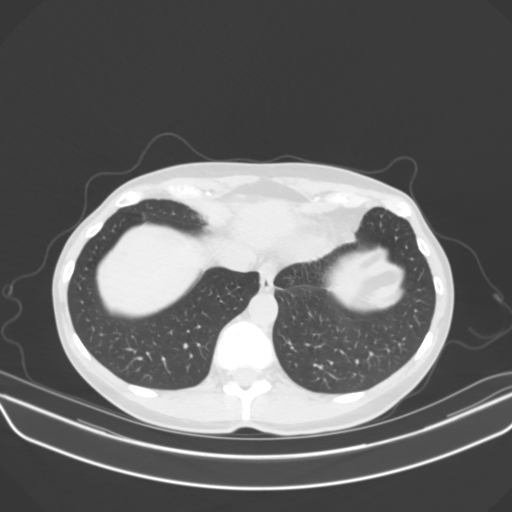
[im 64/173  mediastinal]
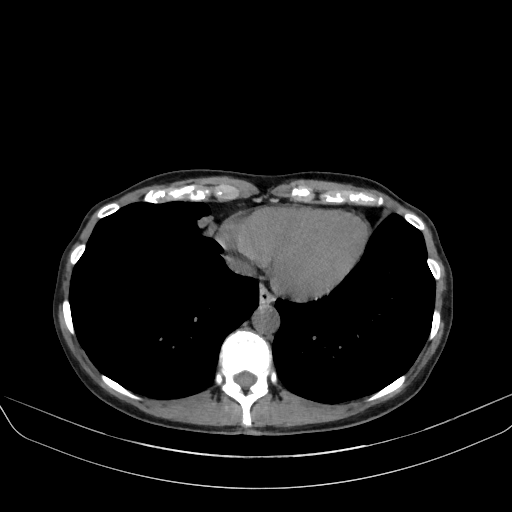
[im 64/173  lung]
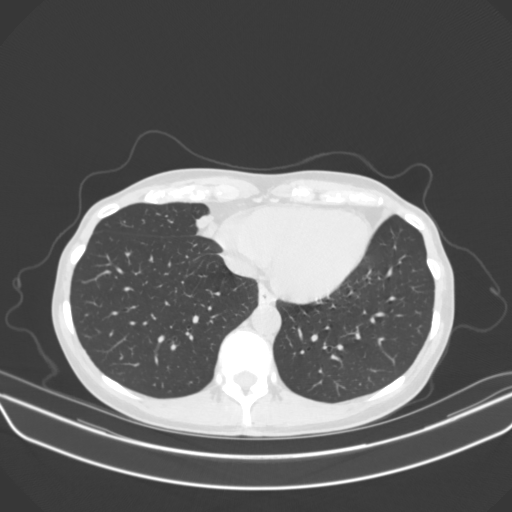
[im 77/173  lung]
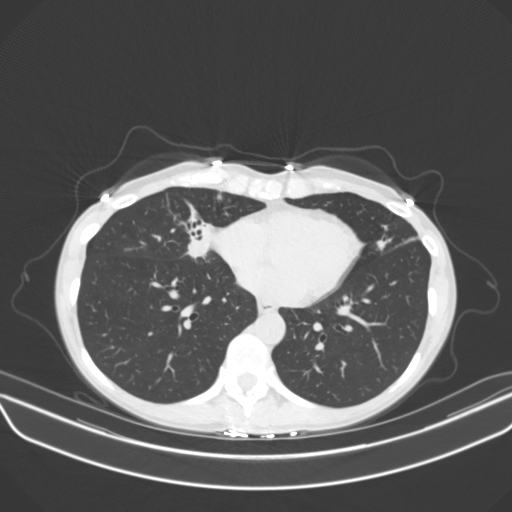
[im 96/173  lung]
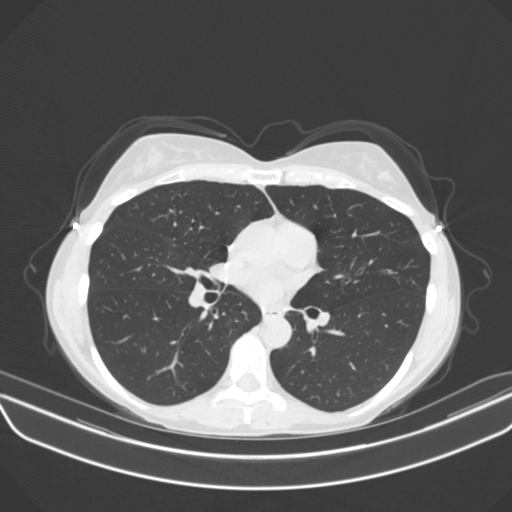
[im 109/173  lung]
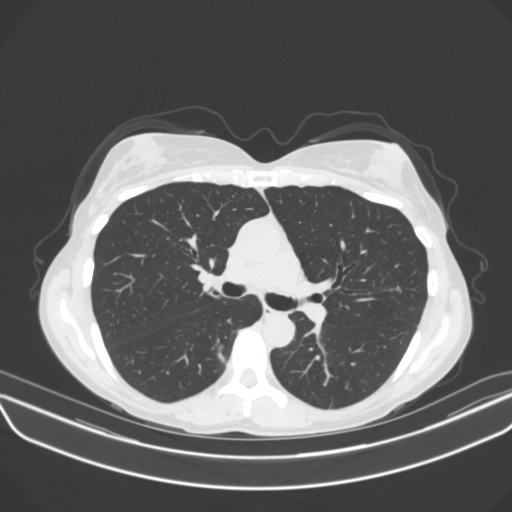
[im 122/173  mediastinal]
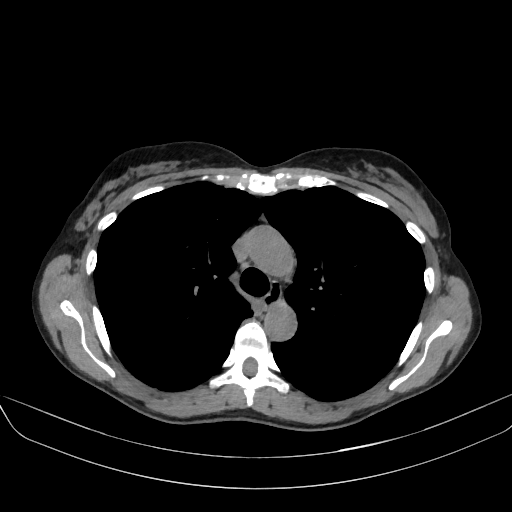
[im 122/173  lung]
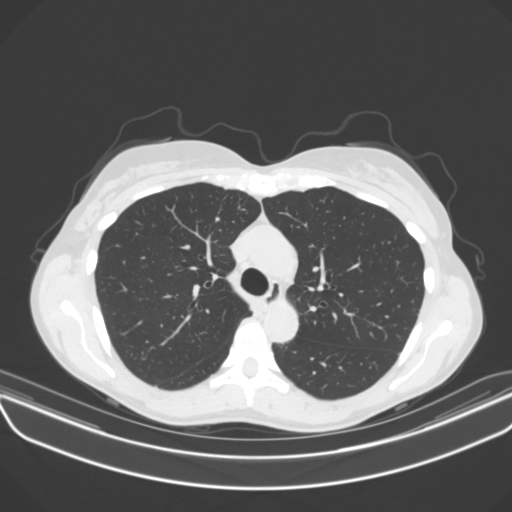
[im 134/173  lung]
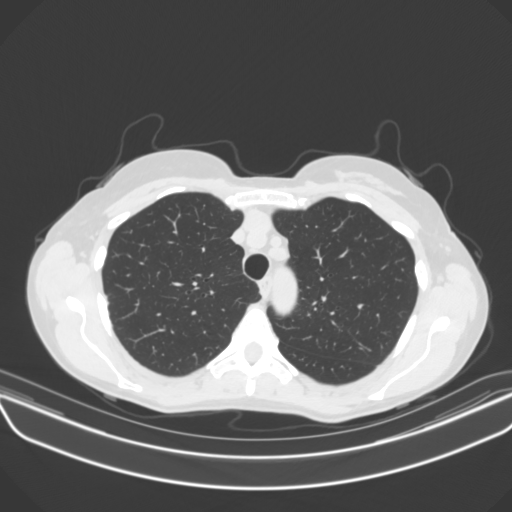
[im 147/173  lung]
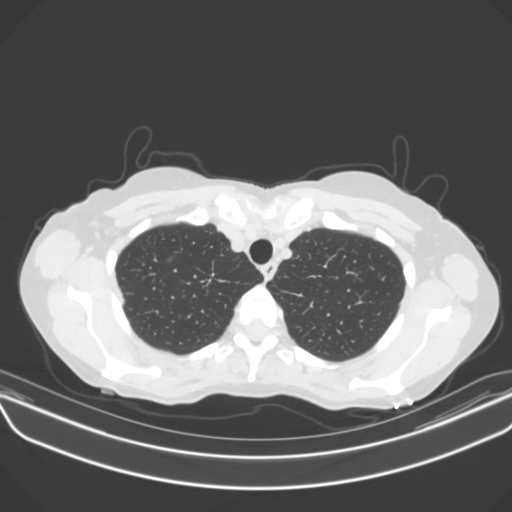
[im 160/173  lung]
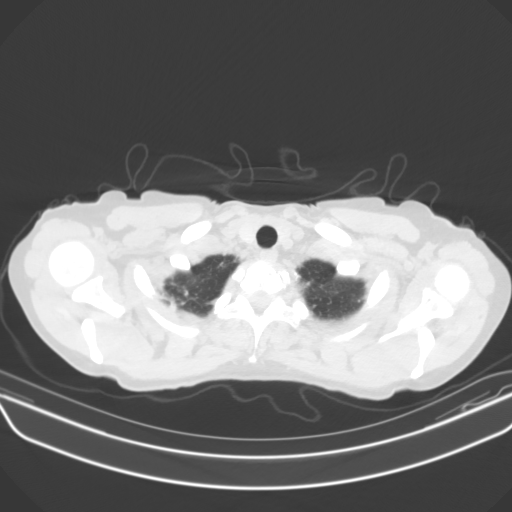

[Series 5: coronal · coronal · 0.70mm/px · 3 of 126 slices shown]
[im 26/126  lung]
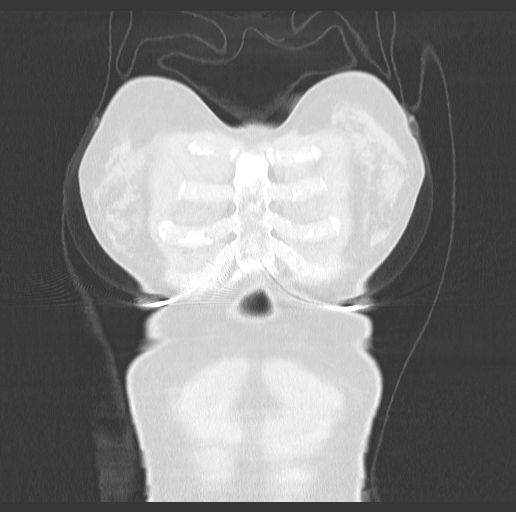
[im 51/126  lung]
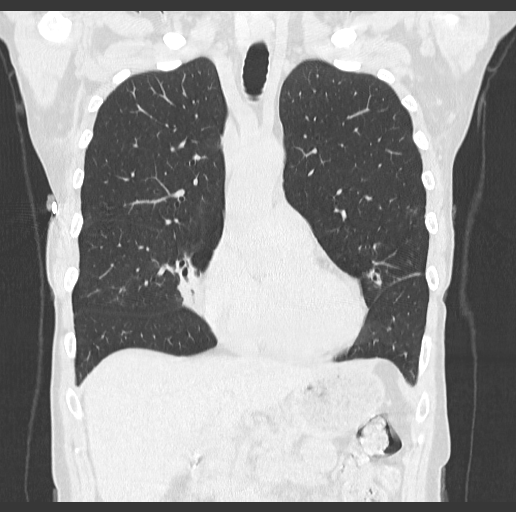
[im 76/126  lung]
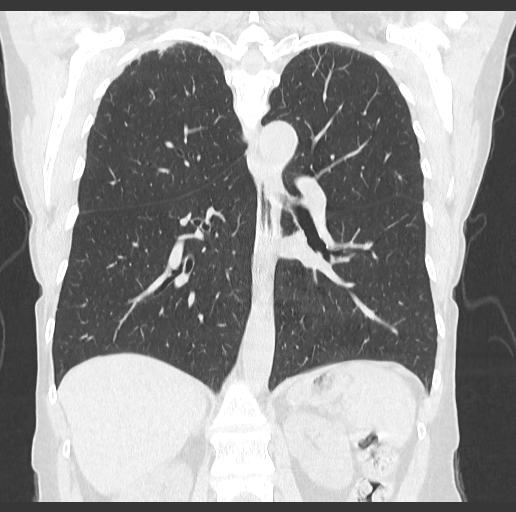

[15 of 36 positions shown; findings below may reference images not displayed]

FINDINGS: There is an approximately 8 mm irregular nodule in the right lower 
lobe, with minimal surrounding groundglass density. This was not present on the 
prior study. There is subsegmental atelectasis/consolidation along the medial 
right middle lobe abutting the mediastinal pleura, showing increase compared to 
the prior study. These findings are indeterminant. PET/CT recommended. 
There is mild bronchiectasis, not significant changed from the prior study. 
There is linear atelectasis in the lingula, present previously. There is a 4 mm 
groundglass opacity in the right upper lobe, image 25, not different from the 
prior study. There is a 4 mm oval nodule in the left lower lobe, image 58, not 
significantly changed. Lungs are hyperinflated. There is marked small right 
thyroid calcifications. There is no mediastinal or axillary lymphadenopathy. The 
heart is not enlarged. 
There is no adrenal nodule. Small amount of air in the thoracic esophagus could 
indicate GERD. There are mild to moderate thoracic spine degenerative changes.
IMPRESSION: There is an irregular 8 mm nodule in the right lower lobe with mild surrounding 
groundglass density, not present previously. This is likely inflammatory, but 
neoplastic involvement cannot be excluded. In addition, there is worsening 
consolidation in the medial aspect of the right middle lobe. PET/CT recommended 
for further evaluation. 
4 mm right upper lobe groundglass opacity is unchanged. Mild bronchiectasis is 
unchanged. There is no pleural effusion or developing adenopathy. Lungs are 
hyperinflated, compatible with COPD. 
RADIATION DOSE REDUCTION: All CT scans are performed using radiation dose 
reduction techniques, when applicable.  Technical factors are evaluated and 
adjusted to ensure appropriate moderation of exposure.  Automated dose 
management technology is applied to adjust the radiation doses to minimize 
exposure while achieving diagnostic quality images.

## 2021-03-29 IMAGING — CT CT CHEST WITHOUT CONTRAST
2 of 3 series · 15 of 36 positions shown, 18 images · non-contrast
Comparison: 10/08/2020 and 09/01/2015

FINAL Diagnostic Imaging Report 
________________________________________________________________________________________________ 
CT CHEST WITHOUT CONTRAST, 03/29/2021 [DATE]: 
CLINICAL INDICATION: Solitary pulmonary nodule 
A search for DICOM formatted images was conducted for prior CT imaging studies 
completed at a non-affiliated media free facility.
TECHNIQUE: The chest was scanned from base of neck through the lung bases 
without contrast on a high resolution low dose CT scanner. Routine MPR and MIP 
3D renderings were reconstructed on an independent workstation with concurrent 
physician supervision.

[Series 2: chest 2.0 i31s 3 · axial · 0.75mm/px · z∈[-382,-98]mm · 12 of 168 slices shown, 15 images]
[im 13/168  mediastinal]
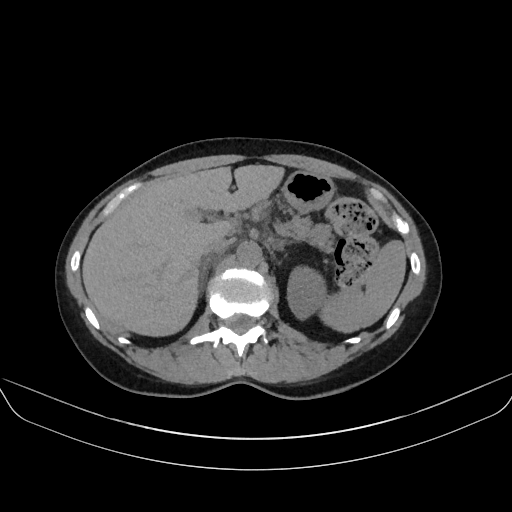
[im 13/168  lung]
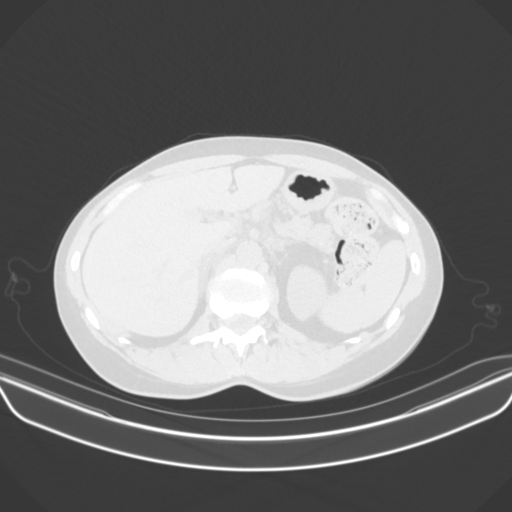
[im 25/168  lung]
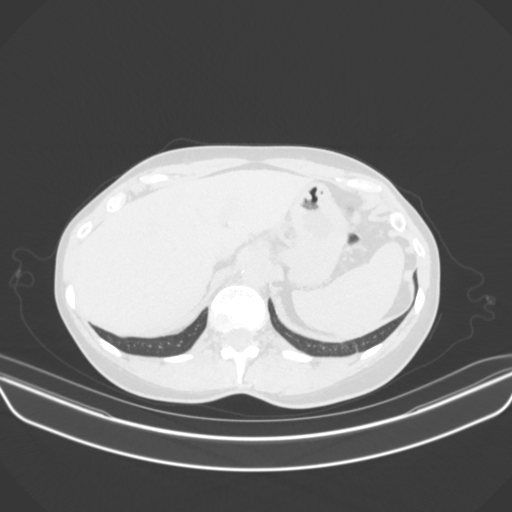
[im 38/168  lung]
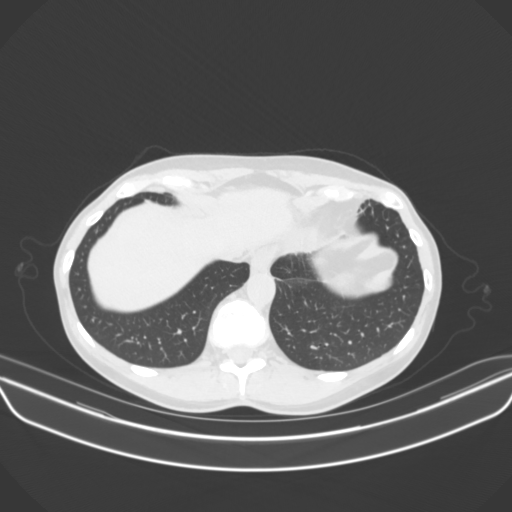
[im 50/168  lung]
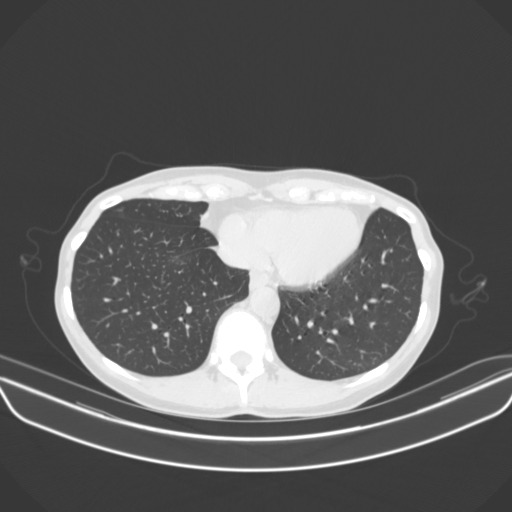
[im 62/168  mediastinal]
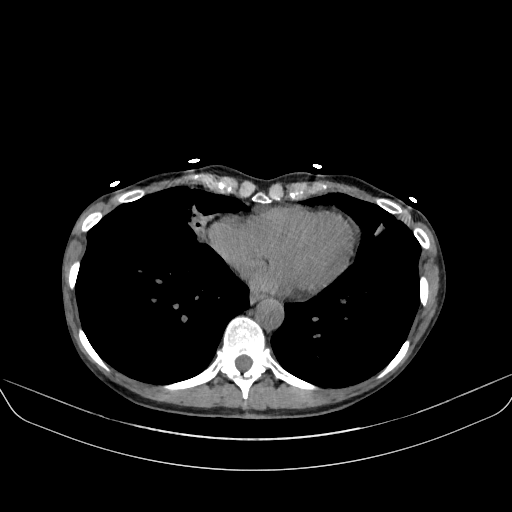
[im 62/168  lung]
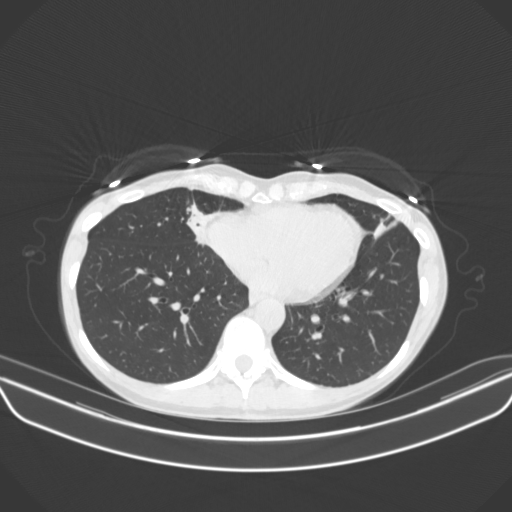
[im 75/168  lung]
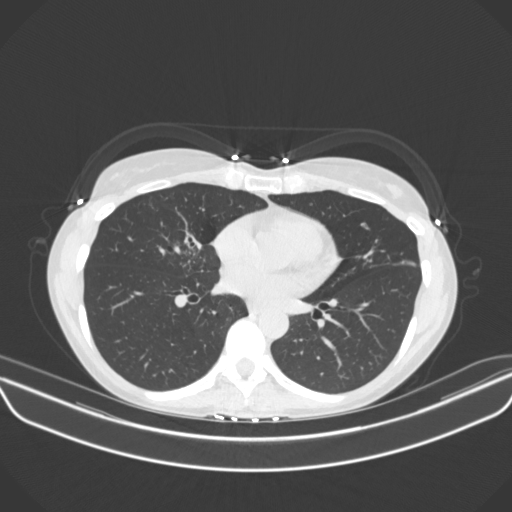
[im 93/168  lung]
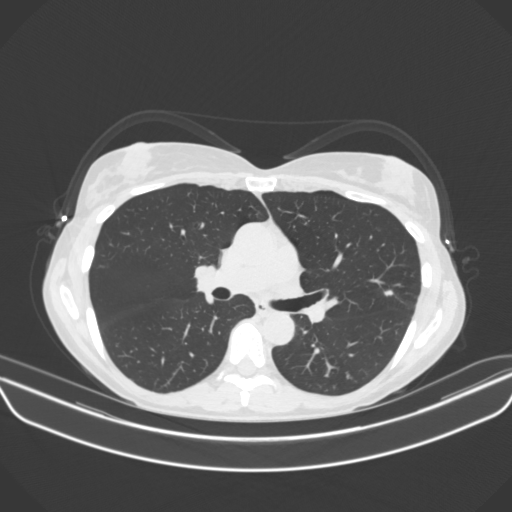
[im 106/168  lung]
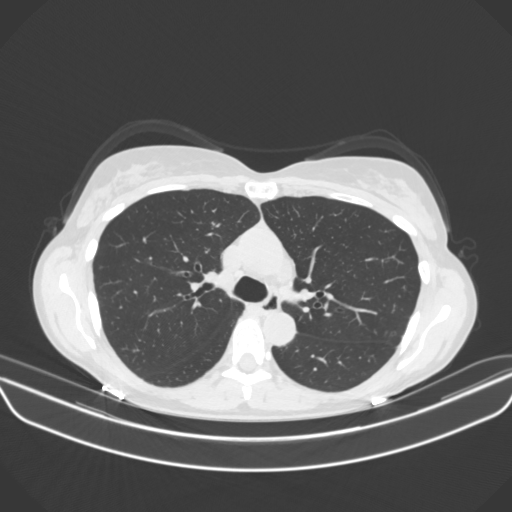
[im 118/168  mediastinal]
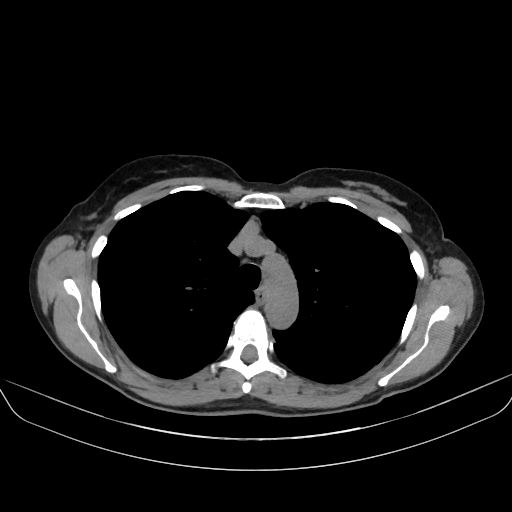
[im 118/168  lung]
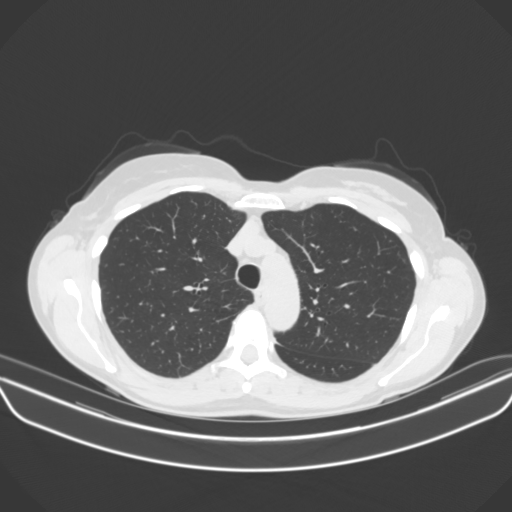
[im 130/168  lung]
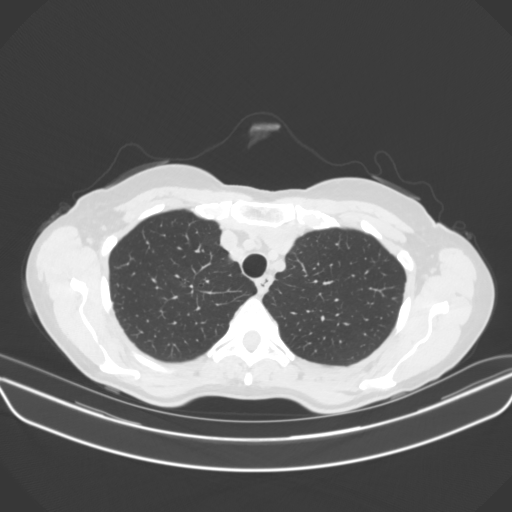
[im 143/168  lung]
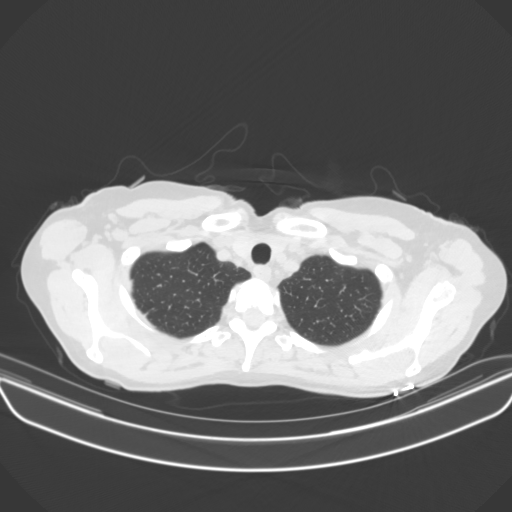
[im 155/168  lung]
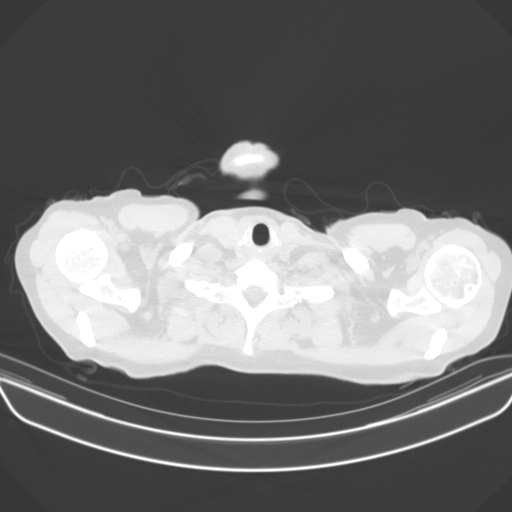

[Series 5: coronal · coronal · 0.67mm/px · 3 of 95 slices shown]
[im 19/95  lung]
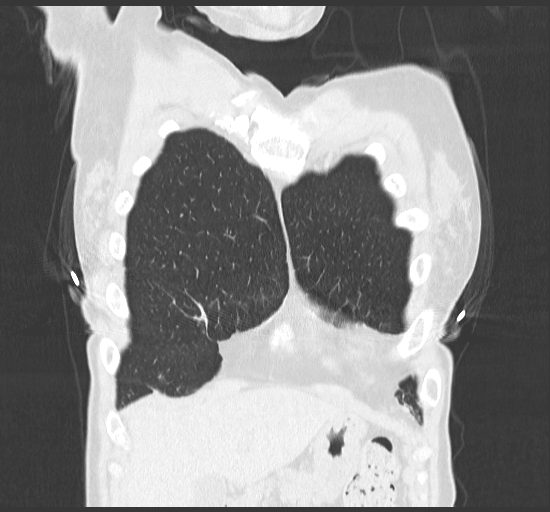
[im 38/95  lung]
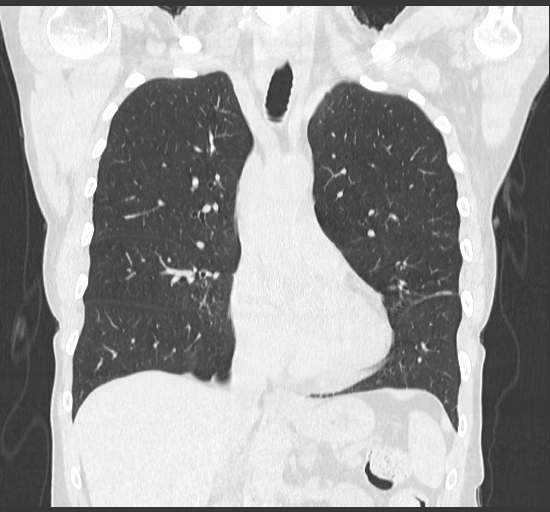
[im 57/95  lung]
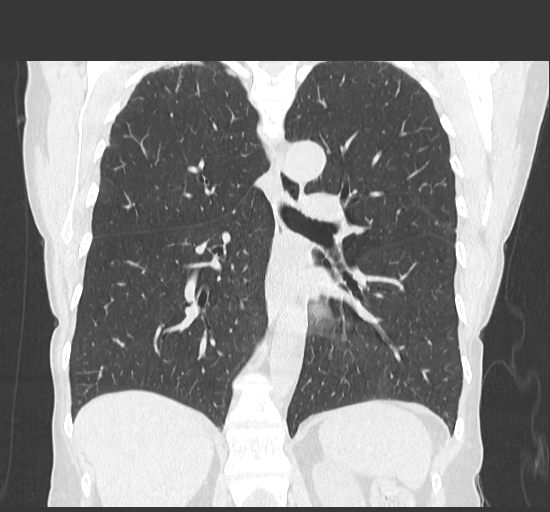

[15 of 36 positions shown; findings below may reference images not displayed]

FINDINGS: LUNGS AND PLEURA:  There is evidence of chronic bronchiectasis and atelectasis 
in the medial segment of the right middle lobe and inferior segment of the 
lingula, more prominently evident on the current exam than the 9027 exam, but 
not significantly different than the more recent comparison. There are small 
nodular densities in the lateral left lung do not appear to changed or are 
decreased in prominence, the largest of which is measuring 4 x 7 mm. The 8 mm 
irregular density in the lateral right lower lobe described on the comparison 
examination has completely resolved. There is a persistent mild focus of 
groundglass density in the right upper lobe, currently most apparent on image 31 
of series 3 measuring approximately 5 mm that has not significantly changed. No 
new abnormalities the lung are identified. No pleural effusions or other signs 
of active pleural disease are apparent. 
MEDIASTINUM:  No active pathologic changes in the mediastinum are detected. 
CHEST WALL/AXILLA: The chest wall and axilla are unremarkable. There appears to 
be a solid nodule in the right thyroid lobe measuring approximately 12 mm. 
UPPER ABDOMEN: No notable findings in the upper abdomen are detected. 
MUSCULOSKELETAL: There our mild degenerative changes in the thoracic spine.
IMPRESSION: No negative interval changes in the appearance the chest are exhibited relative 
to the comparison study. Persistent sites of bronchiectasis and atelectasis are 
evident in the lung. There are nodular densities in the left upper lobe that are 
stable or slightly decreasing in size. The densities in the right lower lobe 
appear to have resolved. There is a persistent small groundglass density in the 
right upper lobe that may be a site of adenomatous hyperplasia. It does not 
appear to have changed to indicate malignant degeneration. 
There appears to be at least one nodule in the right thyroid lobe. 
RADIATION DOSE REDUCTION: All CT scans are performed using radiation dose 
reduction techniques, when applicable.  Technical factors are evaluated and 
adjusted to ensure appropriate moderation of exposure.  Automated dose 
management technology is applied to adjust the radiation doses to minimize 
exposure while achieving diagnostic quality images.

## 2021-08-19 IMAGING — MG MAMMOGRAPHY DIAGNOSTIC BILATERAL 3[PERSON_NAME]
8 series · 8 of 24 positions shown · non-contrast
Comparison: Comparison was made to prior examinations.

________________________________________________________________________________________________ 
******** ADDENDUM #1 ********/n 
CORRECTED TITLE 
MAMMOGRAPHY DIAGNOSTIC BILATERAL 3JIEXA DRON, BREAST ULTRASOUND LIMITED LEFT, 
08/19/2021 [DATE]: 
MAMMOGRAPHY DIAGNOSTIC BILATERAL 3JIEXA DRON, BREAST ULTRASOUND BILATERAL COMPLETE, 
CLINICAL INDICATION: Patient for follow-up of well marginated nodule at the [DATE] 
position of the left breast 1 cm from the nipple.
TECHNIQUE: Digital bilateral mammograms and 3-D Tomosynthesis were obtained. 
These were interpreted both primarily and with the aid of computer-aided 
detection system. Limited left breast ultrasound also performed. 
BREAST DENSITY: (Level C) The breasts are heterogeneously dense, which may 
obscure small masses.

[L MLO]
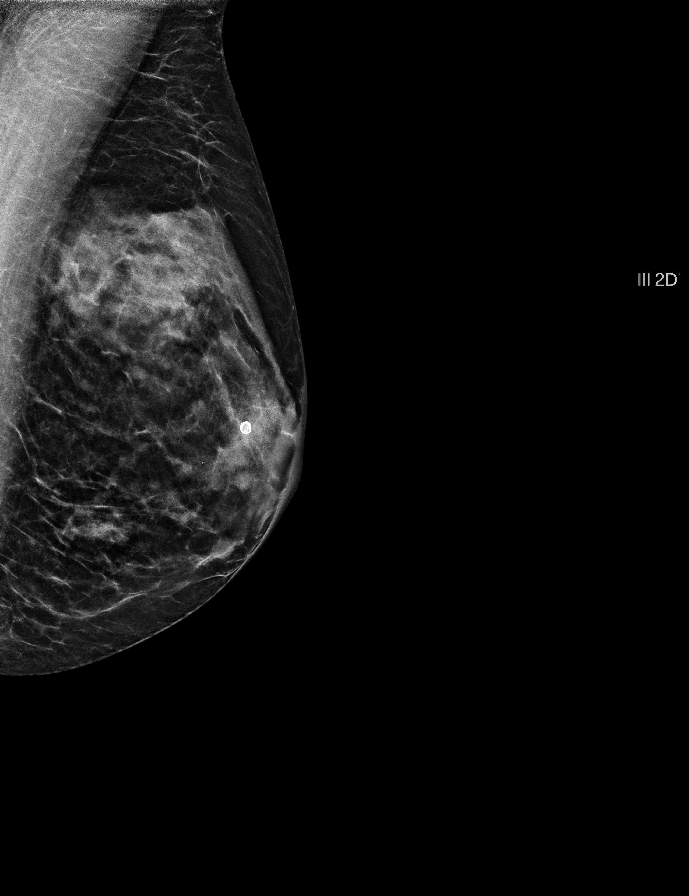

[L CC]
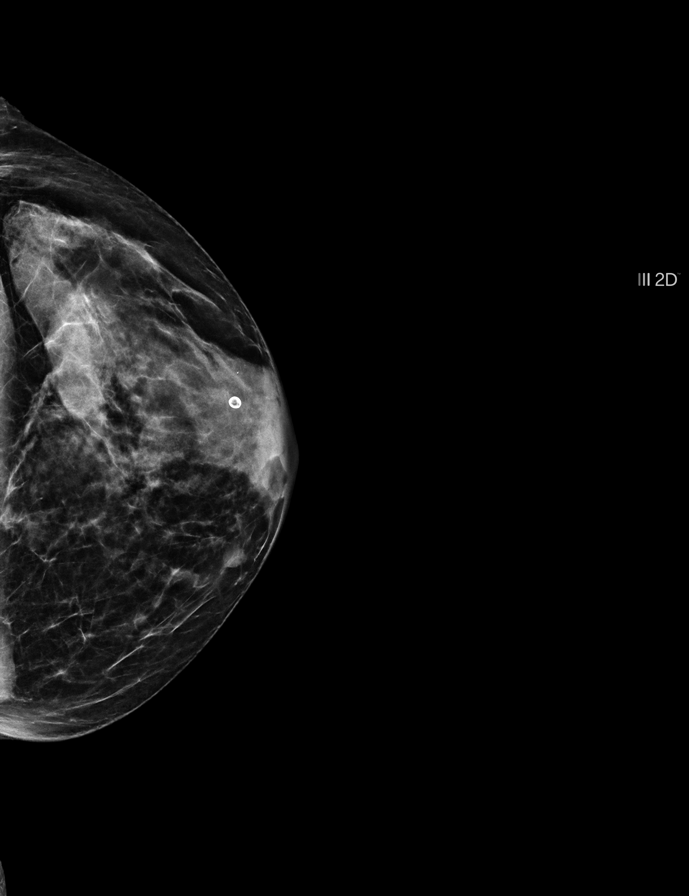

[R CC]
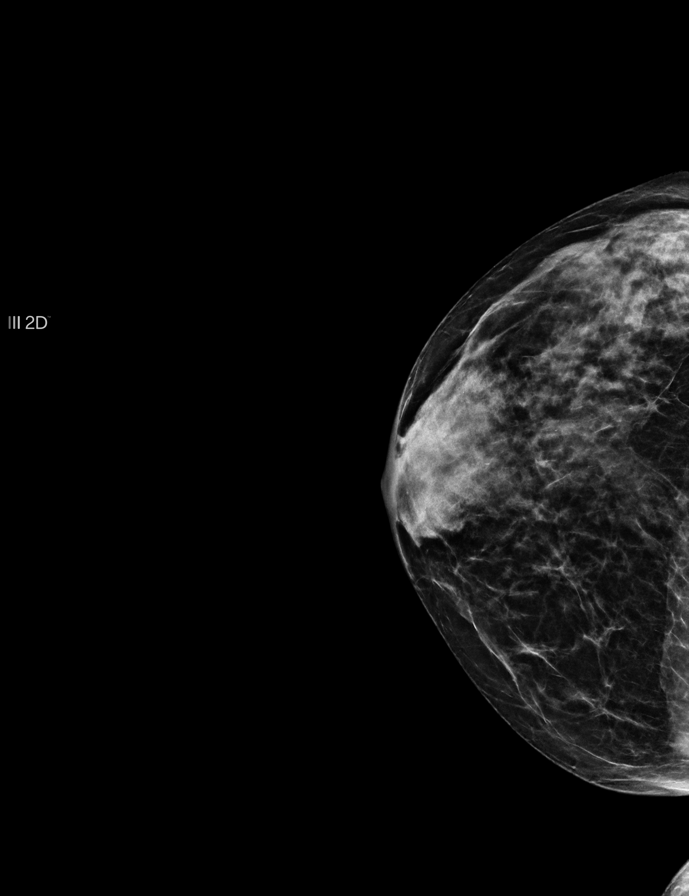

[R MLO]
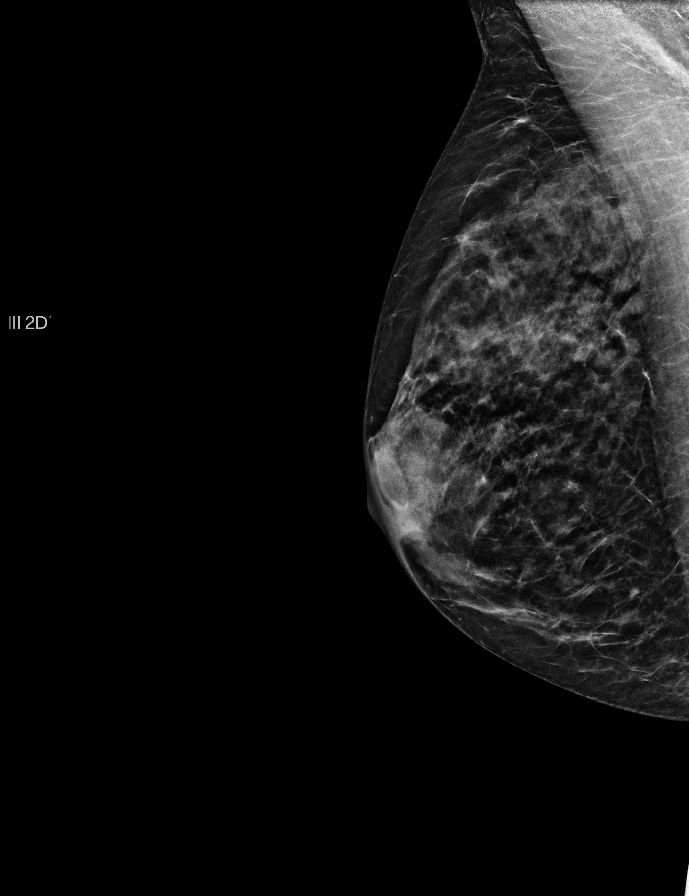

[L CC tomo · tomo slice 24/47.0]
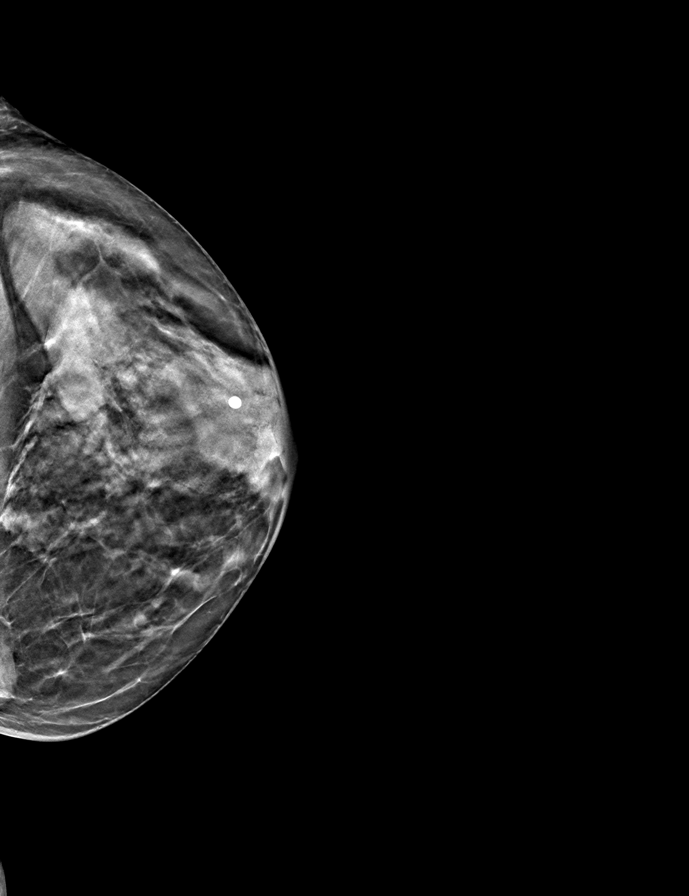

[R MLO tomo · tomo slice 21/42.0]
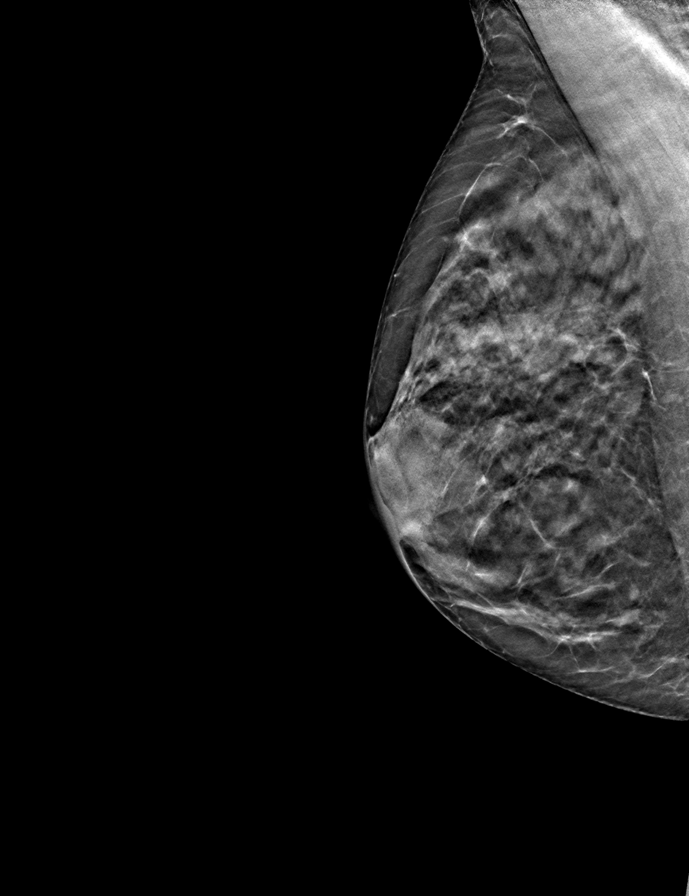

[L MLO tomo · tomo slice 23/45.0]
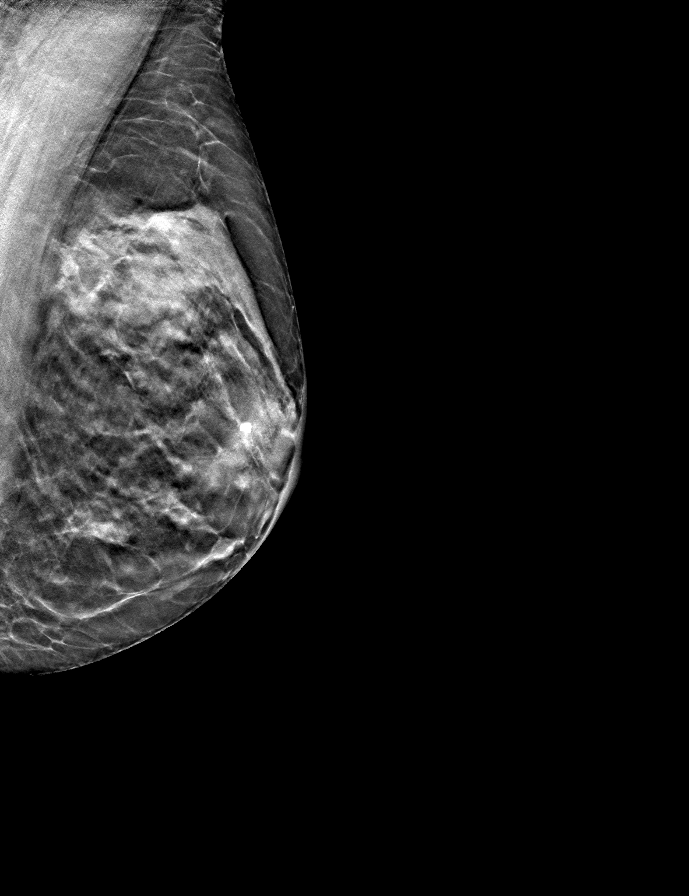

[R CC tomo · tomo slice 21/42.0]
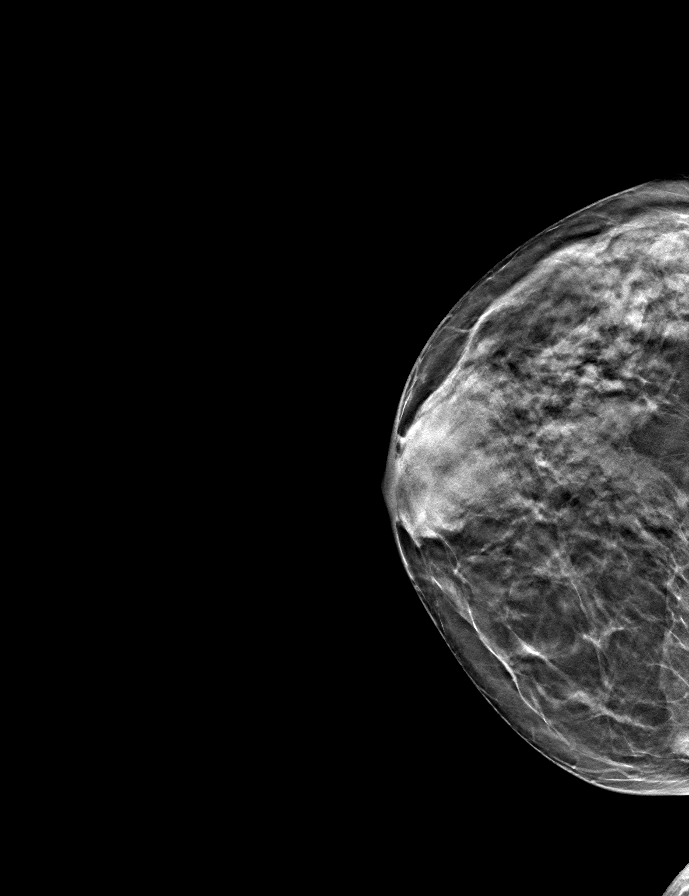

[8 of 24 positions shown; findings below may reference images not displayed]

FINDINGS: Stable well marginated nodular density at the [DATE] position of the 
left breast approximate 1 cm from the nipple, we will get ultrasound for further 
evaluation. No new dominant mass seen in either breast and no suspicious 
clusters of microcalcifications in either breast. 
Limited left breast ultrasound demonstrates an anechoic cyst measuring 5 x 5 x 4 
mm at the [DATE] position of the left breast 1 cm from the nipple with a vessel 
with Doppler flow anterior to the cyst. This is a benign finding does not 
require further evaluation.
IMPRESSION: (BI-RADS 2) Benign findings. Routine mammographic follow-up is recommended.

## 2022-03-26 IMAGING — MR MRI BRAIN WITHOUT CONTRAST
10 of 12 series · 29 of 48 positions shown · non-contrast
Comparison: None

________________________________________________________________________________________________ 
MRA BRAIN WITHOUT CONTRAST, MRI BRAIN WITHOUT CONTRAST, 03/26/2022 [DATE]: 
CLINICAL INDICATION:  Vertigo, balance difficulty
TECHNIQUE: 3-D time-of-flight MRA sequences of the intracranial arterial 
circulation were obtained with 1.2 mm slice thickness, with coronal and cortical 
plane reconstruction. Coronal 2 mm T2 images also acquired. Routine cranial MRI 
images obtained.

[Series 101: survey · axial · 10.0mm · 0.94mm/px · 1 of 5 slices shown]
[im 1/5]
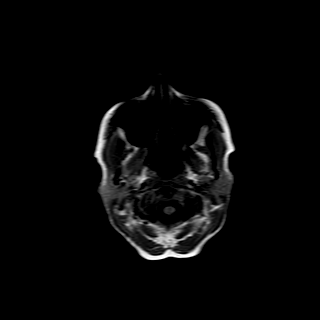

[Series 201: t1_se_sag · sagittal · 4.0mm · 0.49mm/px · 1 of 28 slices shown]
[im 1/28]
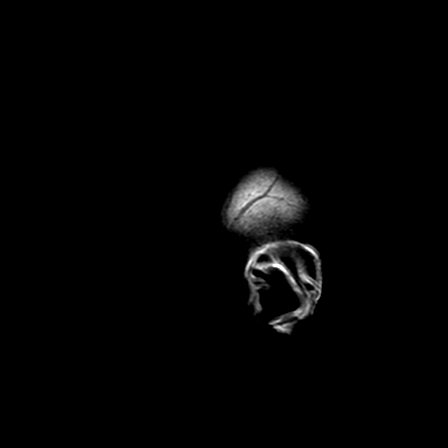

[Series 301: flair_ax · axial · 5.0mm · 0.49mm/px · z∈[-32,+120]mm · 2 of 27 slices shown]
[im 1/27]
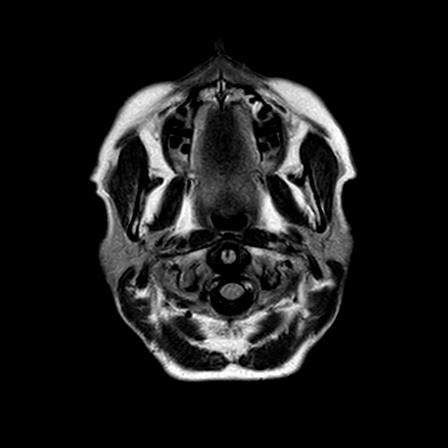
[im 27/27]
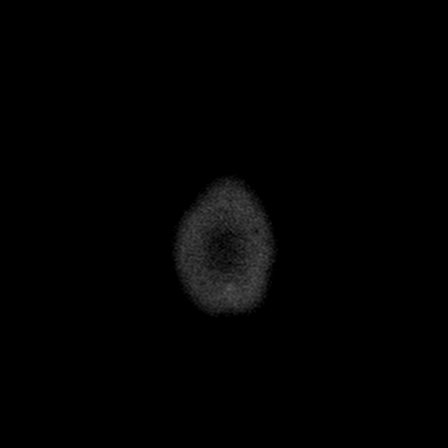

[Series 401: t2_ax · axial · 5.0mm · 0.43mm/px · z∈[-32,+120]mm · 2 of 27 slices shown]
[im 1/27]
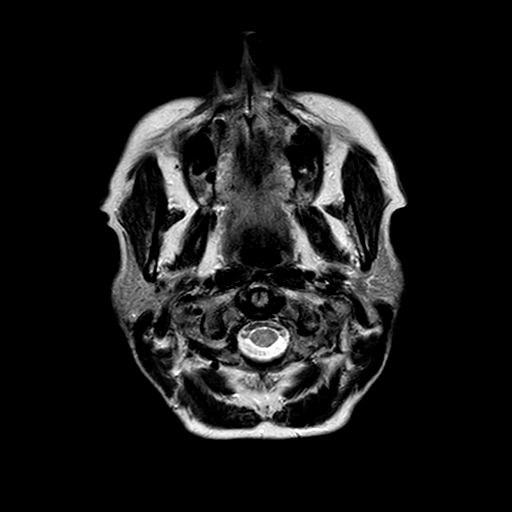
[im 27/27]
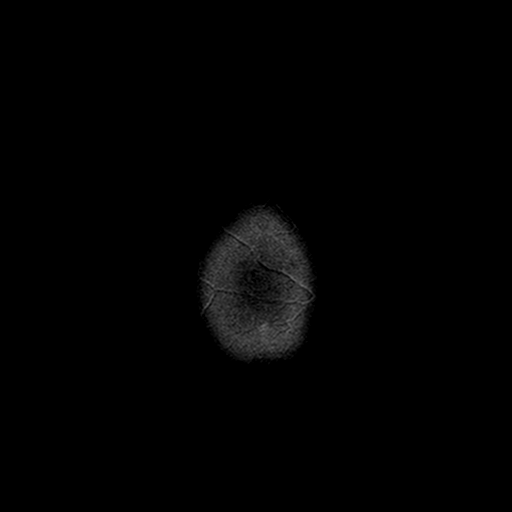

[Series 501: dwi_ax · axial · 5.0mm · 1.03mm/px · z∈[-33,+119]mm · 4 of 54 slices shown]
[im 1/54]
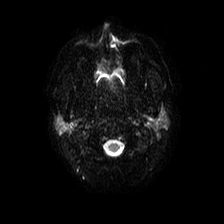
[im 18/54]
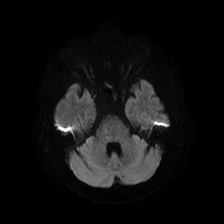
[im 36/54]
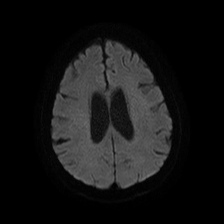
[im 54/54]
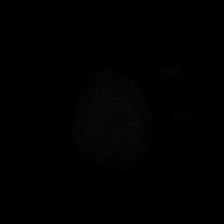

[Series 502: reg - dwi_og · axial · 5.0mm · 1.03mm/px · z∈[-33,+119]mm · 4 of 54 slices shown]
[im 1/54]
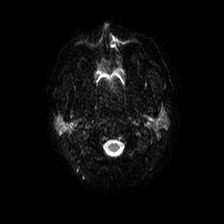
[im 18/54]
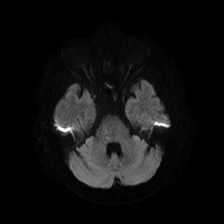
[im 36/54]
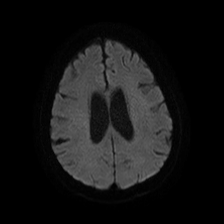
[im 54/54]
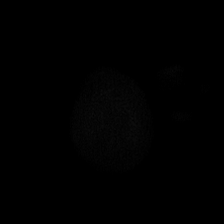

[Series 503: dadc map · axial · 5.0mm · 1.03mm/px · z∈[-33,+119]mm · 2 of 27 slices shown]
[im 1/27]
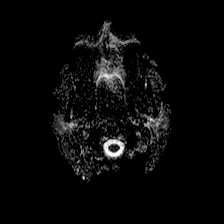
[im 27/27]
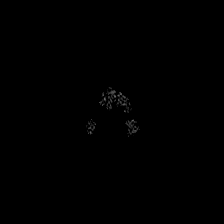

[Series 504: sb0 · axial · 5.0mm · 1.03mm/px · z∈[-33,+119]mm · 2 of 27 slices shown]
[im 1/27]
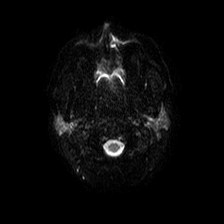
[im 27/27]
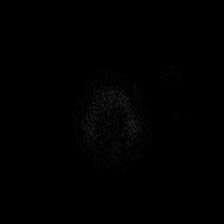

[Series 505: (id) · axial · 5.0mm · 1.03mm/px · z∈[-33,+119]mm · 2 of 27 slices shown]
[im 1/27]
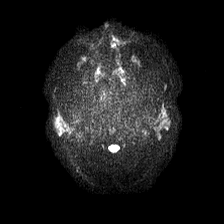
[im 27/27]
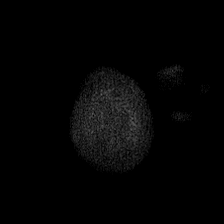

[Series 601: SWI · axial · 3.0mm · 0.40mm/px · z∈[-30,+115]mm · 9 of 200 slices shown]
[im 1/200]
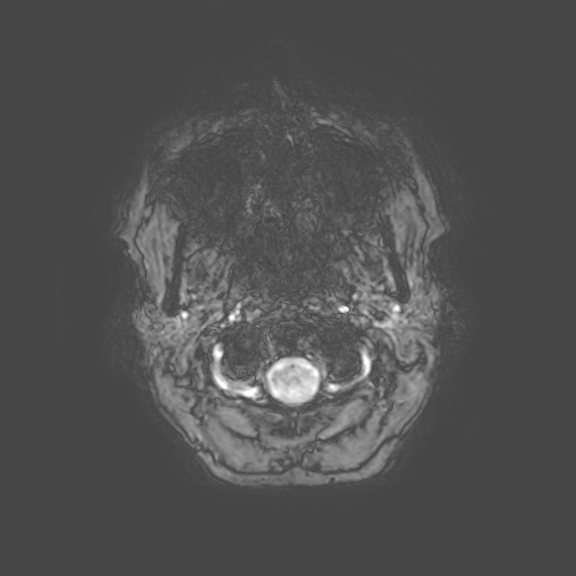
[im 29/200]
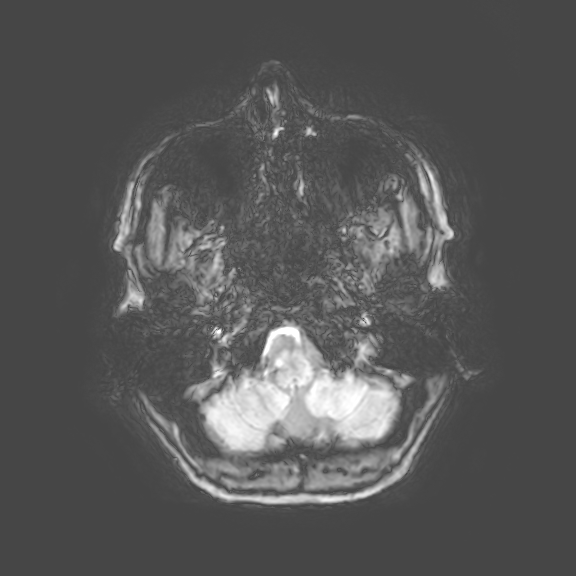
[im 57/200]
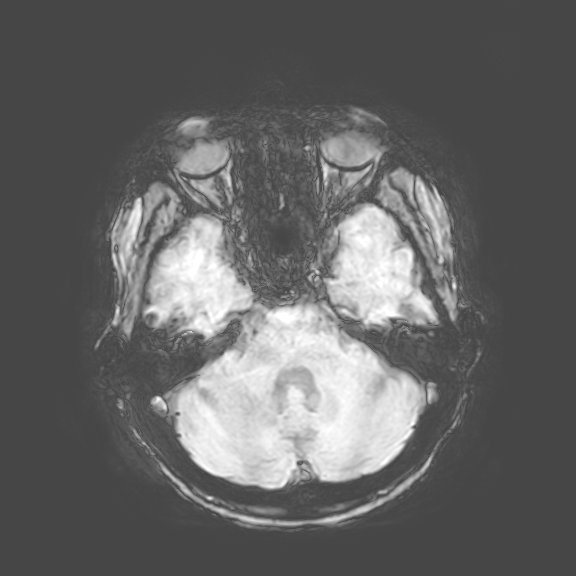
[im 86/200]
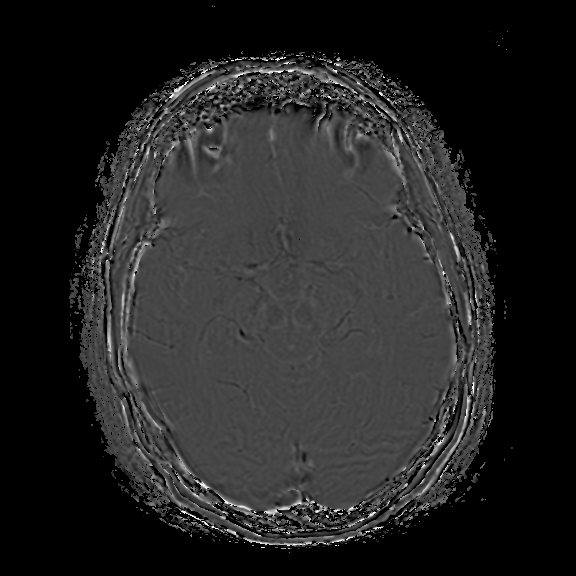
[im 100/200]
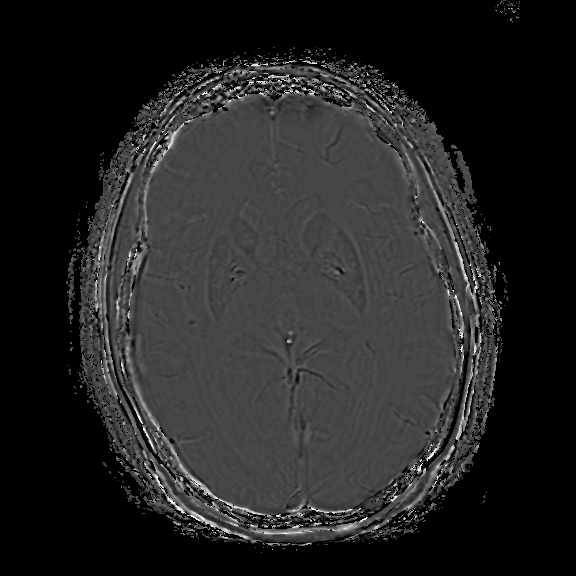
[im 114/200]
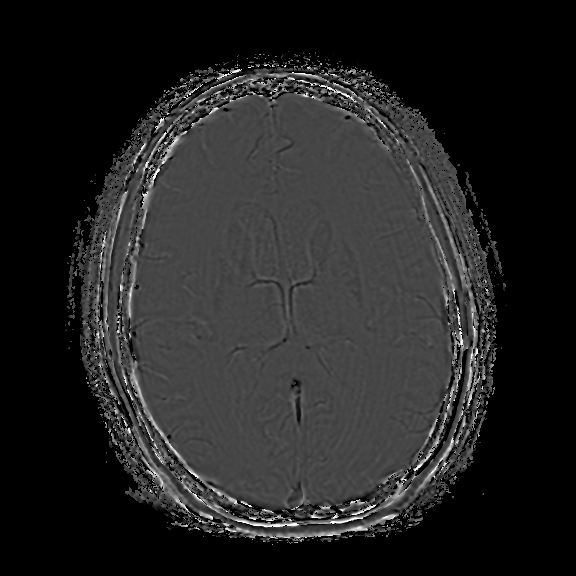
[im 143/200]
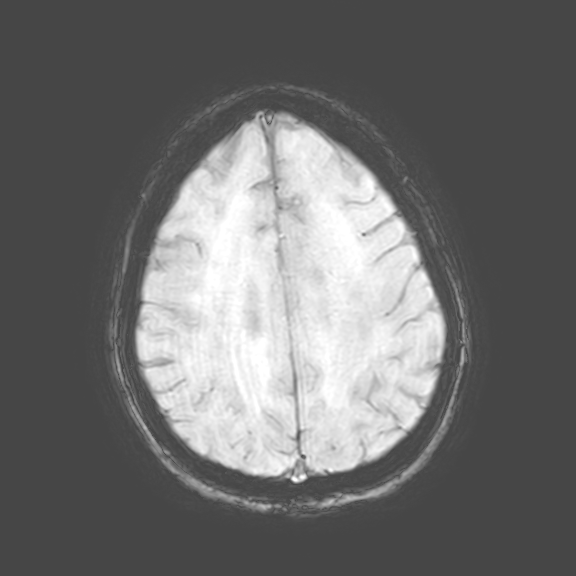
[im 171/200]
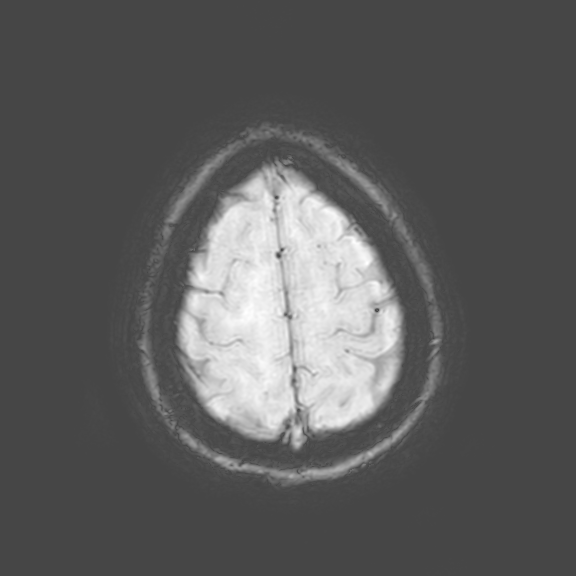
[im 200/200]
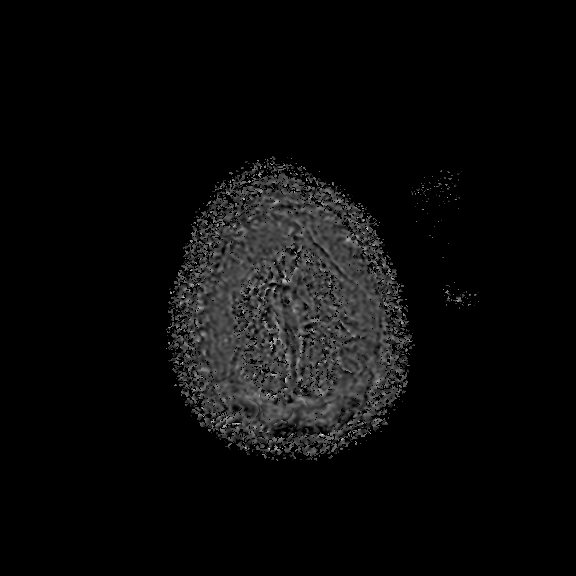

[29 of 48 positions shown; findings below may reference images not displayed]

FINDINGS: Todays MRA images are mildly limited by motion artifact. The 
vertebral arteries are open, the right dominant. The basilar artery is open. 
There is fetal supply to the right posterior cerebral artery. Left posterior 
cerebral artery arises from the basilar tip. No posterior circulation stenosis. 
The upper cervical, petrous, cavernous and supraclinoid internal carotid 
arteries are open. The anterior and middle cerebral arteries are open. Anterior 
communicating artery is open. No significant stenosis. 
No MRA evidence for arteritis or intracranial aneurysm. 
Todays MRI images show no evidence for infarct. Diffusion-weighted images are 
negative. Susceptibility images are negative. There are extensive confluent T2 
hyperintense signal changes in the cerebral white matter. There is extensive 
diffuse pontine T2 hyperintense signal. There is mild ventriculomegaly. 
Craniocervical junction is open. Sellar contents are normal. There is no 
evidence for intracranial mass. 
Paranasal sinuses, tympanic cavities and mastoid air cells are predominantly 
clear. No orbital mass.  
IMPRESSION 
Extensive cerebral and pontine T2 hyperintense signal changes are nonspecific 
but likely represent chronic microangiopathy. Underlying etiologies could be 
present, including hypertension, diabetes, hyperlipidemia and coagulopathy. 
Major intracranial arterial segments are open. No evidence for stenosis or 
aneurysm. Dural sinuses are open. 
Mild ventriculomegaly likely reflects white matter volume loss. There is no 
intracranial mass or recent infarct.

## 2022-03-26 IMAGING — MR MRA BRAIN WITHOUT CONTRAST
2 series · 20 of 48 positions shown · non-contrast
Comparison: None

________________________________________________________________________________________________ 
MRA BRAIN WITHOUT CONTRAST, MRI BRAIN WITHOUT CONTRAST, 03/26/2022 [DATE]: 
CLINICAL INDICATION:  Vertigo, balance difficulty
TECHNIQUE: 3-D time-of-flight MRA sequences of the intracranial arterial 
circulation were obtained with 1.2 mm slice thickness, with coronal and cortical 
plane reconstruction. Coronal 2 mm T2 images also acquired. Routine cranial MRI 
images obtained.

[Series 101: survey · axial · 10.0mm · 0.94mm/px · z∈[+2,+153]mm · 2 of 5 slices shown]
[im 1/5]
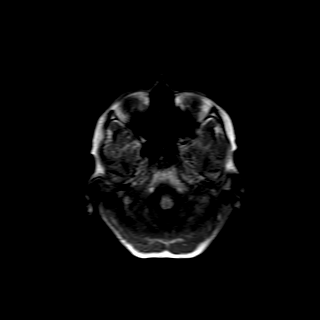
[im 5/5]
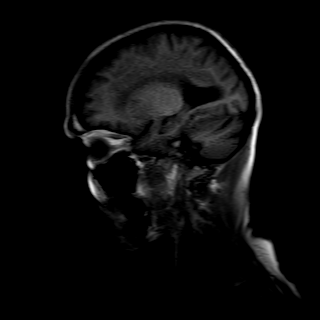

[Series 201: 3d_tof_cow · axial · 1.3mm · 0.38mm/px · z∈[-30,+58]mm · 18 of 144 slices shown]
[im 1/144]
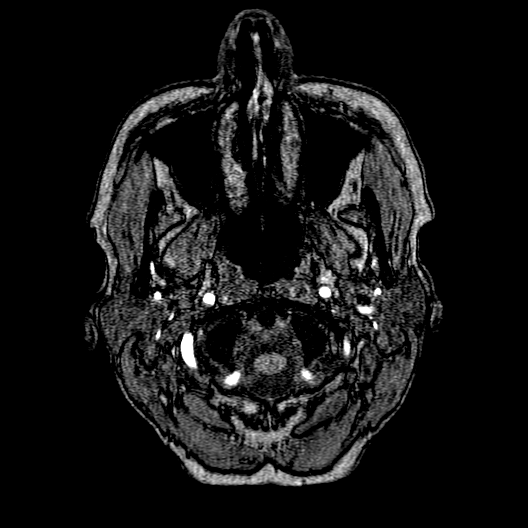
[im 4/144]
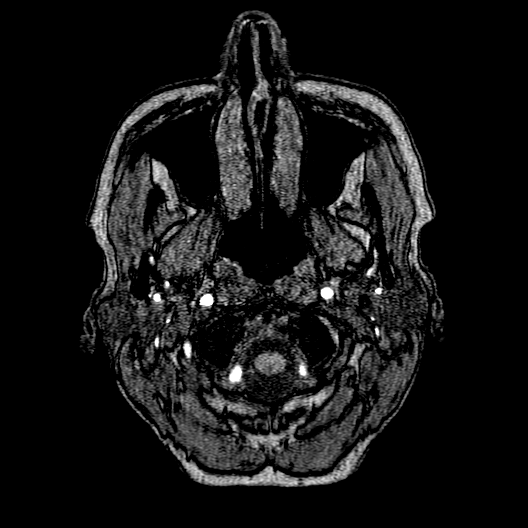
[im 7/144]
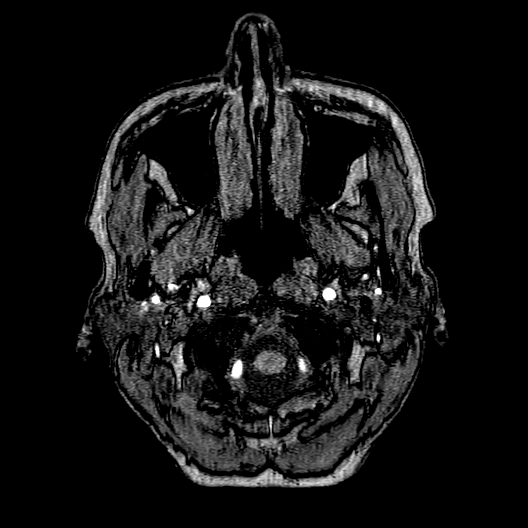
[im 10/144]
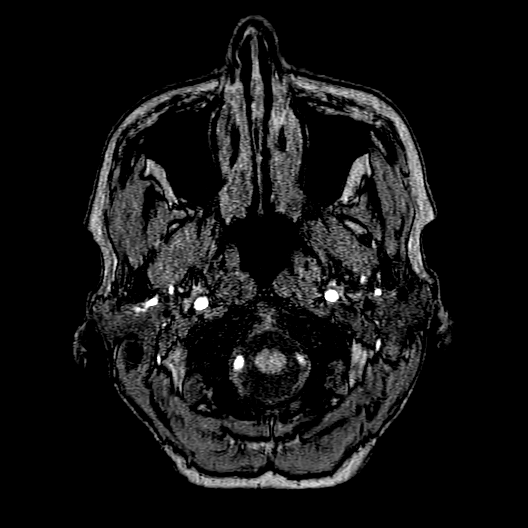
[im 13/144]
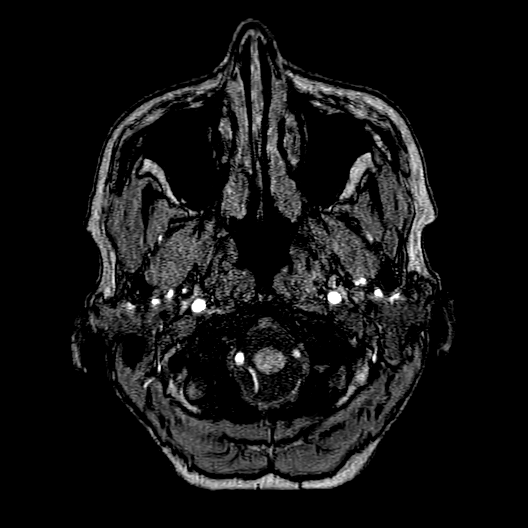
[im 16/144]
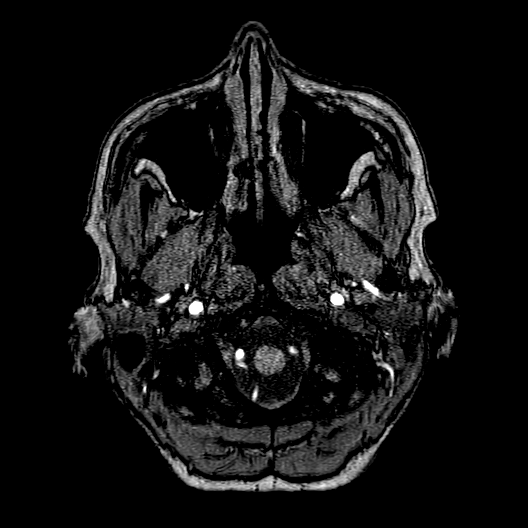
[im 20/144]
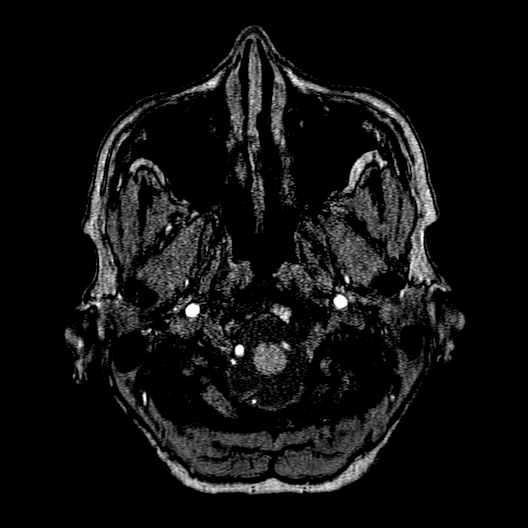
[im 23/144]
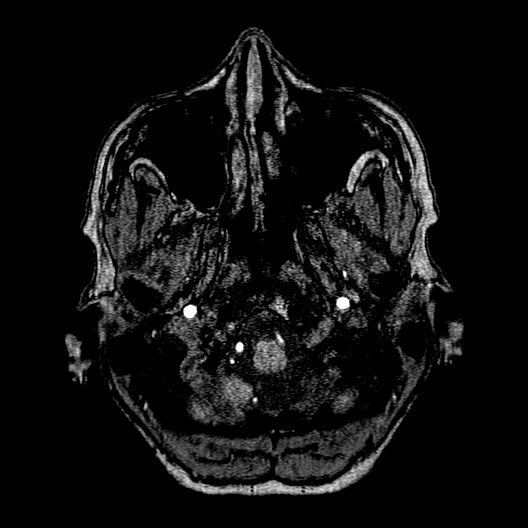
[im 26/144]
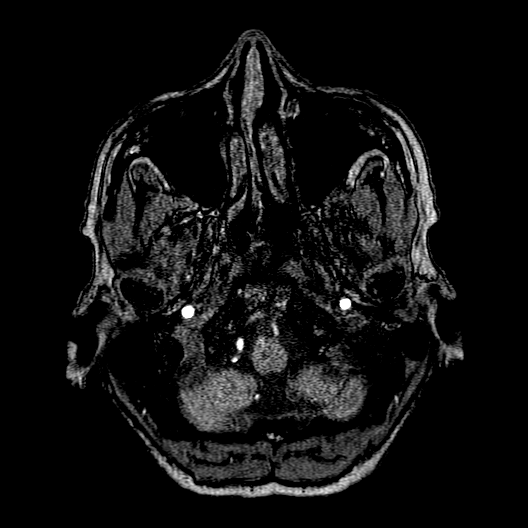
[im 29/144]
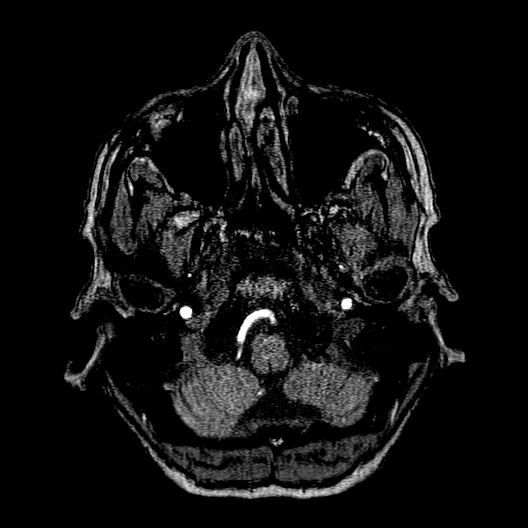
[im 45/144]
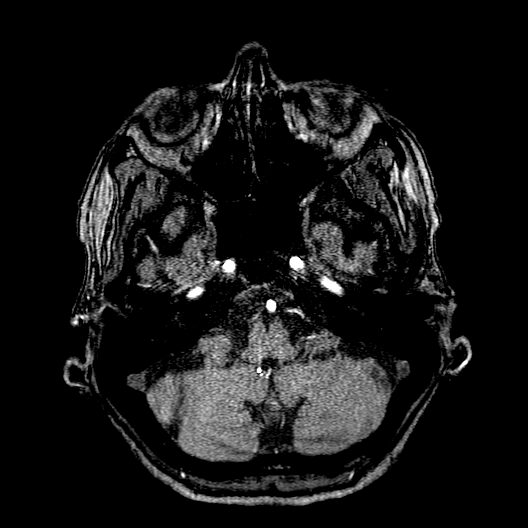
[im 64/144]
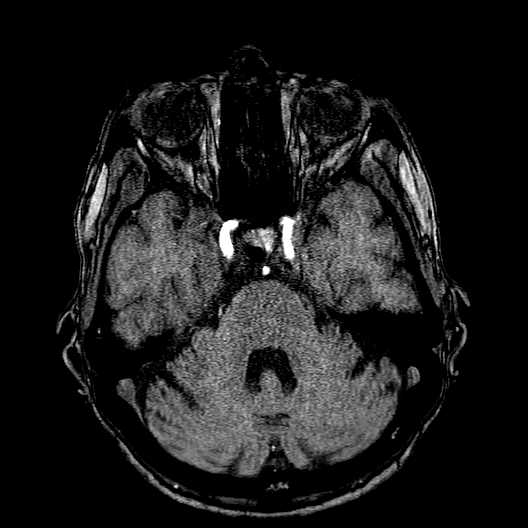
[im 74/144]
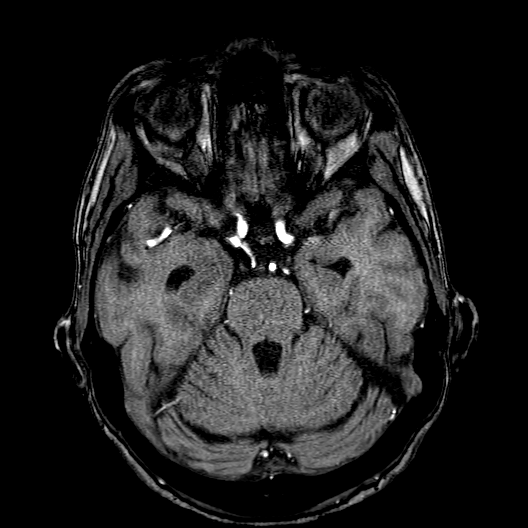
[im 80/144]
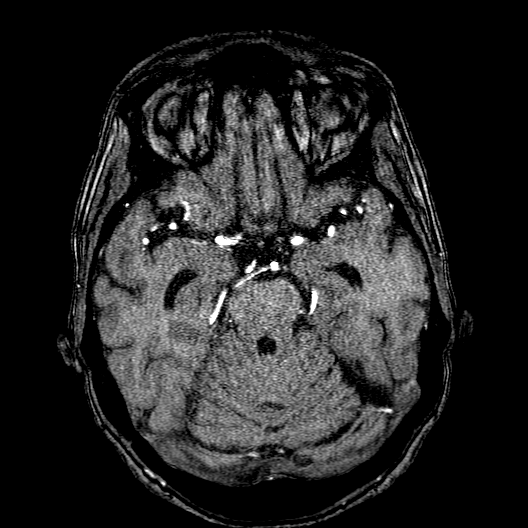
[im 99/144]
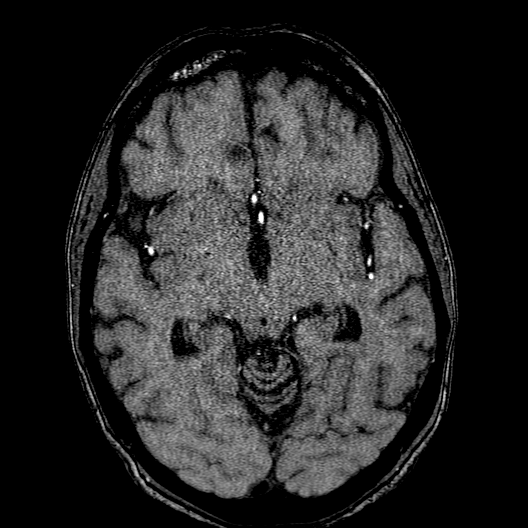
[im 118/144]
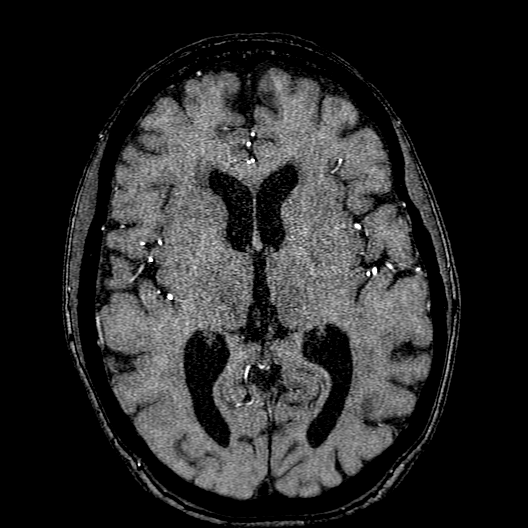
[im 121/144]
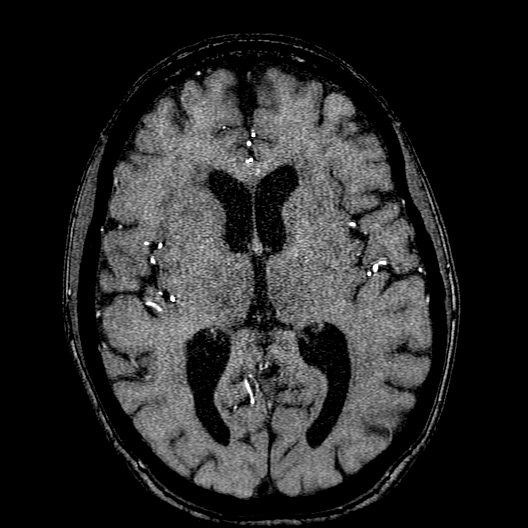
[im 137/144]
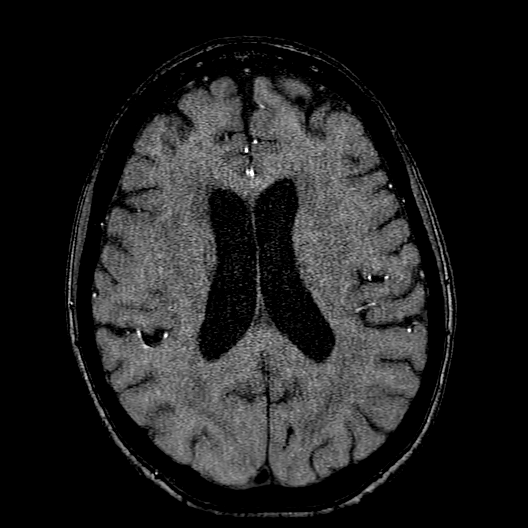

[20 of 48 positions shown; findings below may reference images not displayed]

FINDINGS: Todays MRA images are mildly limited by motion artifact. The 
vertebral arteries are open, the right dominant. The basilar artery is open. 
There is fetal supply to the right posterior cerebral artery. Left posterior 
cerebral artery arises from the basilar tip. No posterior circulation stenosis. 
The upper cervical, petrous, cavernous and supraclinoid internal carotid 
arteries are open. The anterior and middle cerebral arteries are open. Anterior 
communicating artery is open. No significant stenosis. 
No MRA evidence for arteritis or intracranial aneurysm. 
Todays MRI images show no evidence for infarct. Diffusion-weighted images are 
negative. Susceptibility images are negative. There are extensive confluent T2 
hyperintense signal changes in the cerebral white matter. There is extensive 
diffuse pontine T2 hyperintense signal. There is mild ventriculomegaly. 
Craniocervical junction is open. Sellar contents are normal. There is no 
evidence for intracranial mass. 
Paranasal sinuses, tympanic cavities and mastoid air cells are predominantly 
clear. No orbital mass.  
IMPRESSION 
Extensive cerebral and pontine T2 hyperintense signal changes are nonspecific 
but likely represent chronic microangiopathy. Underlying etiologies could be 
present, including hypertension, diabetes, hyperlipidemia and coagulopathy. 
Major intracranial arterial segments are open. No evidence for stenosis or 
aneurysm. Dural sinuses are open. 
Mild ventriculomegaly likely reflects white matter volume loss. There is no 
intracranial mass or recent infarct.
